# Patient Record
Sex: Female | Born: 1990 | Race: Black or African American | Hispanic: No | Marital: Single | Smoking: Current every day smoker
Health system: Southern US, Community
[De-identification: ages and names within clinical notes are randomized; demographics above are authoritative.]

## PROBLEM LIST (undated history)

## (undated) ENCOUNTER — Emergency Department (HOSPITAL_COMMUNITY): Payer: BC Managed Care – PPO

## (undated) ENCOUNTER — Inpatient Hospital Stay (HOSPITAL_COMMUNITY): Payer: Self-pay

## (undated) DIAGNOSIS — D219 Benign neoplasm of connective and other soft tissue, unspecified: Secondary | ICD-10-CM

## (undated) DIAGNOSIS — Z9189 Other specified personal risk factors, not elsewhere classified: Secondary | ICD-10-CM

## (undated) HISTORY — PX: APPENDECTOMY: SHX54

---

## 2015-04-17 ENCOUNTER — Other Ambulatory Visit (HOSPITAL_COMMUNITY)
Admission: RE | Admit: 2015-04-17 | Discharge: 2015-04-17 | Disposition: A | Discharge status: Home / Self Care | Payer: Self-pay | Source: Ambulatory Visit | Attending: Family Medicine | Admitting: Family Medicine

## 2015-04-17 ENCOUNTER — Encounter (HOSPITAL_COMMUNITY): Payer: Self-pay | Admitting: Emergency Medicine

## 2015-04-17 ENCOUNTER — Emergency Department (INDEPENDENT_AMBULATORY_CARE_PROVIDER_SITE_OTHER)
Admission: EM | Admit: 2015-04-17 | Discharge: 2015-04-17 | Disposition: A | Discharge status: Home / Self Care | Payer: Self-pay | Source: Home / Self Care | Attending: Family Medicine | Admitting: Family Medicine

## 2015-04-17 DIAGNOSIS — M545 Low back pain, unspecified: Secondary | ICD-10-CM

## 2015-04-17 DIAGNOSIS — M791 Myalgia, unspecified site: Secondary | ICD-10-CM

## 2015-04-17 DIAGNOSIS — N760 Acute vaginitis: Secondary | ICD-10-CM | POA: Insufficient documentation

## 2015-04-17 DIAGNOSIS — N730 Acute parametritis and pelvic cellulitis: Secondary | ICD-10-CM

## 2015-04-17 DIAGNOSIS — Z113 Encounter for screening for infections with a predominantly sexual mode of transmission: Secondary | ICD-10-CM | POA: Insufficient documentation

## 2015-04-17 LAB — POCT URINALYSIS DIP (DEVICE)
Bilirubin Urine: NEGATIVE
Glucose, UA: NEGATIVE mg/dL
Ketones, ur: NEGATIVE mg/dL
NITRITE: NEGATIVE
PH: 7 (ref 5.0–8.0)
PROTEIN: NEGATIVE mg/dL
SPECIFIC GRAVITY, URINE: 1.02 (ref 1.005–1.030)
Urobilinogen, UA: 0.2 mg/dL (ref 0.0–1.0)

## 2015-04-17 LAB — POCT PREGNANCY, URINE: Preg Test, Ur: NEGATIVE

## 2015-04-17 MED ORDER — CEFTRIAXONE SODIUM 1 G IJ SOLR
0.5000 g | Freq: Once | INTRAMUSCULAR | Status: AC
  Administered 2015-04-17: 0.5 g via INTRAMUSCULAR

## 2015-04-17 MED ORDER — AZITHROMYCIN 250 MG PO TABS
ORAL_TABLET | ORAL | Status: AC
  Filled 2015-04-17: qty 4

## 2015-04-17 MED ORDER — CEFTRIAXONE SODIUM 1 G IJ SOLR
INTRAMUSCULAR | Status: AC
  Filled 2015-04-17: qty 50

## 2015-04-17 MED ORDER — LIDOCAINE HCL (PF) 1 % IJ SOLN
INTRAMUSCULAR | Status: AC
  Filled 2015-04-17: qty 5

## 2015-04-17 MED ORDER — HYDROCODONE-ACETAMINOPHEN 5-325 MG PO TABS: 1.0000 | ORAL_TABLET | ORAL | Status: DC | PRN

## 2015-04-17 MED ORDER — AZITHROMYCIN 250 MG PO TABS
1000.0000 mg | ORAL_TABLET | Freq: Every day | ORAL | Status: DC
  Administered 2015-04-17: 1000 mg via ORAL

## 2015-04-17 MED ORDER — METRONIDAZOLE 500 MG PO TABS: 500.0000 mg | ORAL_TABLET | Freq: Two times a day (BID) | ORAL | Status: DC

## 2015-04-17 NOTE — ED Provider Notes (Signed)
CSN: [id number]     Arrival date & time 04/17/15  1534 History   First MD Initiated Contact with Patient 04/17/15 1656     Chief Complaint  Patient presents with  . Abdominal Pain   (Consider location/radiation/quality/duration/timing/severity/associated sxs/prior Treatment) HPI Comments: 24 year old obese female complaining of severe lower abdominal pain for 3 days. She states is been getting worse. She denies nausea, vomiting, diarrhea or fever. She does have urinary frequency but no dysuria. Denies vaginal discharge or bleeding. Denies fevers. She told the provider that nothing makes it worse but she told the nurse that it is worse when she laughs, pushes to urinate or sits forward. Lower abdominal pain associated with low back pain.   History reviewed. No pertinent past medical history. No past surgical history on file. History reviewed. No pertinent family history. History  Substance Use Topics  . Smoking status: Not on file  . Smokeless tobacco: Not on file  . Alcohol Use: Not on file   OB History    No data available     Review of Systems  Constitutional: Positive for activity change and appetite change. Negative for fever.  HENT: Negative.   Eyes: Negative.   Respiratory: Negative for cough and shortness of breath.   Cardiovascular: Negative for chest pain and leg swelling.  Gastrointestinal: Positive for abdominal pain. Negative for nausea, vomiting, diarrhea, constipation and abdominal distention.  Genitourinary: Positive for frequency. Negative for dysuria, urgency, vaginal bleeding, vaginal discharge and vaginal pain.  Musculoskeletal: Positive for back pain.  Skin: Negative.     Allergies  Review of patient's allergies indicates no known allergies.  Home Medications   Prior to Admission medications   Medication Sig Start Date End Date Taking? Authorizing Provider  HYDROcodone-acetaminophen (NORCO/VICODIN) 5-325 MG per tablet Take 1 tablet by mouth every 4  (four) hours as needed. 04/17/15   Janne Napoleon, NP  metroNIDAZOLE (FLAGYL) 500 MG tablet Take 1 tablet (500 mg total) by mouth 2 (two) times daily. X 7 days 04/17/15   Janne Napoleon, NP   BP 108/74 mmHg  Pulse 88  Temp(Src) 99.1 F (37.3 C) (Oral)  Resp 20  SpO2 97%  LMP 04/08/2015 Physical Exam  Constitutional: She is oriented to person, place, and time. She appears well-developed and well-nourished. No distress.  Neck: Normal range of motion. Neck supple.  Cardiovascular: Normal rate and normal heart sounds.   Pulmonary/Chest: Effort normal and breath sounds normal. No respiratory distress.  Abdominal: She exhibits no distension. There is tenderness. There is guarding.  Abdomen obese. Patient points to a overlying layer or roll of adipose as the source of pain. When this is retracted the pain is reproduced with palpation across the suprapubic and pelvis.  Genitourinary:  Normal external female genitalia with clitoral hood pierced jewelry. Thin bubbly green to tan vaginal discharge. Cervix midline. Several nabothian cyst. Several areas of patchy erythema to the ectocervix. Severe CMT and bilateral adnexal tenderness.  Musculoskeletal:  Tenderness that reproduces the pain located at the right low back musculature. No spinal tenderness or pain.   Neurological: She is alert and oriented to person, place, and time.  Skin: Skin is warm and dry.  Psychiatric: She has a normal mood and affect.  Nursing note and vitals reviewed.   ED Course  Procedures (including critical care time) Labs Review Labs Reviewed  POCT URINALYSIS DIP (DEVICE) - Abnormal; Notable for the following:    Hgb urine dipstick TRACE (*)    Leukocytes, UA TRACE (*)  All other components within normal limits  URINE CULTURE  POCT PREGNANCY, URINE  CERVICOVAGINAL ANCILLARY ONLY   Results for orders placed or performed during the hospital encounter of 04/17/15  POCT urinalysis dip (device)  Result Value Ref Range    Glucose, UA NEGATIVE NEGATIVE mg/dL   Bilirubin Urine NEGATIVE NEGATIVE   Ketones, ur NEGATIVE NEGATIVE mg/dL   Specific Gravity, Urine 1.020 1.005 - 1.030   Hgb urine dipstick TRACE (A) NEGATIVE   pH 7.0 5.0 - 8.0   Protein, ur NEGATIVE NEGATIVE mg/dL   Urobilinogen, UA 0.2 0.0 - 1.0 mg/dL   Nitrite NEGATIVE NEGATIVE   Leukocytes, UA TRACE (A) NEGATIVE  Pregnancy, urine POC  Result Value Ref Range   Preg Test, Ur NEGATIVE NEGATIVE    Imaging Review No results found.   MDM   1. PID (acute pelvic inflammatory disease)   2. Right-sided low back pain without sciatica   3. Muscle pain    [place], [place] present during exam. Rocephin 500 mg IM azithro 1 gm po Flagyl 500 bid norco 5 mg #15 Urine cult and cerv cytology pendin If worse go to the ED, may need U/S or IV ABX depending on presentation.    Janne Napoleon, NP 04/17/15 [phone]

## 2015-04-17 NOTE — Discharge Instructions (Signed)
Pelvic Inflammatory Disease Pelvic inflammatory disease (PID) refers to an infection in some or all of the female organs. The infection can be in the uterus, ovaries, fallopian tubes, or the surrounding tissues in the pelvis. PID can cause abdominal or pelvic pain that comes on suddenly (acute pelvic pain). PID is a serious infection because it can lead to lasting (chronic) pelvic pain or the inability to have children (infertile).  CAUSES  The infection is often caused by the normal bacteria found in the vaginal tissues. PID may also be caused by an infection that is spread during sexual contact. PID can also occur following:   The birth of a baby.   A miscarriage.   An abortion.   Major pelvic surgery.   The use of an intrauterine device (IUD).   A sexual assault.  RISK FACTORS Certain factors can put a person at higher risk for PID, such as:  Being younger than 25 years.  Being sexually active at [place] age.  Usingnonbarrier contraception.  Havingmultiple sexual partners.  Having sex with someone who has symptoms of a genital infection.  Using oral contraception. Other times, certain behaviors can increase the possibility of getting PID, such as:  Having sex during your period.  Using a vaginal douche.  Having an intrauterine device (IUD) in place. SYMPTOMS   Abdominal or pelvic pain.   Fever.   Chills.   Abnormal vaginal discharge.  Abnormal uterine bleeding.   Unusual pain shortly after finishing your period. DIAGNOSIS  Your caregiver will choose some of the following methods to make a diagnosis, such as:   Performinga physical exam and history. A pelvic exam typically reveals a very tender uterus and surrounding pelvis.   Ordering laboratory tests including a pregnancy test, blood tests, and urine test.  Orderingcultures of the vagina and cervix to check for a sexually transmitted infection (STI).  Performing an ultrasound.    Performing a laparoscopic procedure to look inside the pelvis.  TREATMENT   Antibiotic medicines may be prescribed and taken by mouth.   Sexual partners may be treated when the infection is caused by a sexually transmitted disease (STD).   Hospitalization may be needed to give antibiotics intravenously.  Surgery may be needed, but this is rare. It may take weeks until you are completely well. If you are diagnosed with PID, you should also be checked for human immunodeficiency virus (HIV). HOME CARE INSTRUCTIONS   If given, take your antibiotics as directed. Finish the medicine even if you start to feel better.   Only take over-the-counter or prescription medicines for pain, discomfort, or fever as directed by your caregiver.   Do not have sexual intercourse until treatment is completed or as directed by your caregiver. If PID is confirmed, your recent sexual partner(s) will need treatment.   Keep your follow-up appointments. SEEK MEDICAL CARE IF:   You have increased or abnormal vaginal discharge.   You need prescription medicine for your pain.   You vomit.   You cannot take your medicines.   Your partner has an STD.  SEEK IMMEDIATE MEDICAL CARE IF:   You have a fever.   You have increased abdominal or pelvic pain.   You have chills.   You have pain when you urinate.   You are not better after 72 hours following treatment.  MAKE SURE YOU:   Understand these instructions.  Will watch your condition.  Will get help right away if you are not doing well or get worse.  Document Released: 12/03/2005 Document Revised: 03/30/2013 Document Reviewed: 11/29/2011 [facility] Patient Information 2015 [place], [place]. This information is not intended to replace advice given to you by your health care provider. Make sure you discuss any questions you have with your health care provider.  Sexually Transmitted Disease A sexually transmitted disease (STD) is a  disease or infection often passed to another person during sex. However, STDs can be passed through nonsexual ways. An STD can be passed through:  Spit (saliva).  Semen.  Blood.  Mucus from the vagina.  Pee (urine). HOW CAN I LESSEN MY CHANCES OF GETTING AN STD?  Use:  Latex condoms.  Water-soluble lubricants with condoms. Do not use petroleum jelly or oils.  Dental dams. These are small pieces of latex that are used as a barrier during oral sex.  Avoid having more than one sex partner.  Do not have sex with someone who has other sex partners.  Do not have sex with anyone you do not know or who is at high risk for an STD.  Avoid risky sex that can break your skin.  Do not have sex if you have open sores on your mouth or skin.  Avoid drinking too much alcohol or taking illegal drugs. Alcohol and drugs can affect your good judgment.  Avoid oral and anal sex acts.  Get shots (vaccines) for HPV and hepatitis.  If you are at risk of being infected with HIV, it is advised that you take a certain medicine daily to prevent HIV infection. This is called pre-exposure prophylaxis (PrEP). You may be at risk if:  You are a man who has sex with other men (MSM).  You are attracted to the opposite sex (heterosexual) and are having sex with more than one partner.  You take drugs with a needle.  You have sex with someone who has HIV.  Talk with your doctor about if you are at high risk of being infected with HIV. If you begin to take PrEP, get tested for HIV first. Get tested every 3 months for as long as you are taking PrEP. WHAT SHOULD I DO IF I THINK I HAVE AN STD?  See your doctor.  Tell your sex partner(s) that you have an STD. They should be tested and treated.  Do not have sex until your doctor says it is okay. WHEN SHOULD I GET HELP? Get help right away if:  You have bad belly (abdominal) pain.  You are a man and have puffiness (swelling) or pain in your  testicles.  You are a woman and have puffiness in your vagina. Document Released: 01/10/2005 Document Revised: 12/08/2013 Document Reviewed: 05/29/2013 [facility] Patient Information 2015 [place], [place]. This information is not intended to replace advice given to you by your health care provider. Make sure you discuss any questions you have with your health care provider.

## 2015-04-17 NOTE — ED Notes (Signed)
C/o abd pain States she has lower abd pain which started three days ago States she has pain when she laughs, pushes to urinate and sitting forward States she is urinating frequently Does have lower back pain

## 2015-04-18 LAB — URINE CULTURE
COLONY COUNT: NO GROWTH
CULTURE: NO GROWTH
Special Requests: NORMAL

## 2015-04-18 LAB — CERVICOVAGINAL ANCILLARY ONLY
Chlamydia: NEGATIVE
NEISSERIA GONORRHEA: POSITIVE — AB
WET PREP (BD AFFIRM): POSITIVE — AB

## 2015-04-19 ENCOUNTER — Telehealth (HOSPITAL_COMMUNITY): Payer: Self-pay | Admitting: *Deleted

## 2015-04-19 NOTE — ED Notes (Addendum)
GC pos. Chlamydia neg., Trich pos., Urine culture: no growth.  Pt. adequately treated with Rocephin and Zithromax for GC and Flagyl for Trich. I called pt. and left a message to call. Call 1. Brianna Oliver 04/19/2015 Pt. called back.  Pt. verified x 2 and given results.  Pt. told she was adequately treated for both infections and to finish all of the Flagyl. She said she has not picked it up yet.  Pt. instructed to notify her partner to be tested and treated for both infections, no sex until Flagyl is finished and partner has been treated and to practice safe sex. Pt. told she should get HIV testing at the [facility]D clinic, by appointment.  Pt. voiced understanding.  No questions. Brianna Oliver 04/19/2015 DHHS form completed and faxed to the [facility] Department. 04/20/2015

## 2015-12-12 ENCOUNTER — Emergency Department (HOSPITAL_COMMUNITY)
Admission: EM | Admit: 2015-12-12 | Discharge: 2015-12-12 | Disposition: A | Discharge status: Home / Self Care | Payer: Self-pay | Attending: Emergency Medicine | Admitting: Emergency Medicine

## 2015-12-12 ENCOUNTER — Encounter (HOSPITAL_COMMUNITY): Payer: Self-pay | Admitting: *Deleted

## 2015-12-12 DIAGNOSIS — B353 Tinea pedis: Secondary | ICD-10-CM | POA: Insufficient documentation

## 2015-12-12 DIAGNOSIS — L03116 Cellulitis of left lower limb: Secondary | ICD-10-CM | POA: Insufficient documentation

## 2015-12-12 MED ORDER — CEPHALEXIN 500 MG PO CAPS: 500.0000 mg | ORAL_CAPSULE | Freq: Four times a day (QID) | ORAL | Status: DC

## 2015-12-12 MED ORDER — CLOTRIMAZOLE 1 % EX CREA: TOPICAL_CREAM | CUTANEOUS | Status: DC

## 2015-12-12 MED ORDER — IBUPROFEN 800 MG PO TABS: 800.0000 mg | ORAL_TABLET | Freq: Three times a day (TID) | ORAL | Status: DC | PRN

## 2015-12-12 NOTE — ED Notes (Signed)
Declined W/C at D/C and was escorted to lobby by RN. 

## 2015-12-12 NOTE — ED Provider Notes (Signed)
CSN: [id number]     Arrival date & time 12/12/15  1021 History   First MD Initiated Contact with Patient 12/12/15 1052     Chief Complaint  Patient presents with  . Foot Pain     (Consider location/radiation/quality/duration/timing/severity/associated sxs/prior Treatment) HPI   Pt presents with left heel pain.  States the skin has been cracked and red x 6 days and painful x 3 days.  Pain is sore and throbbing, aching, 10/10 intensity, radiating up into ankle and calf.  Denies fevers, chills, myalgias, CP, SOB, hemoptysis.  She has taken nothing for pain.  Works on her feet all day cleaning.  Denies any injury or falls.   History reviewed. No pertinent past medical history. History reviewed. No pertinent past surgical history. No family history on file. Social History  Substance Use Topics  . Smoking status: None  . Smokeless tobacco: None  . Alcohol Use: None   OB History    No data available     Review of Systems  Constitutional: Negative for fever and chills.  Cardiovascular: Negative for leg swelling.  Musculoskeletal: Negative for myalgias.  Skin: Positive for color change.  Allergic/Immunologic: Negative for immunocompromised state.  Neurological: Negative for weakness and numbness.  Hematological: Does not bruise/bleed easily.  Psychiatric/Behavioral: Negative for self-injury.      Allergies  Review of patient's allergies indicates no known allergies.  Home Medications   Prior to Admission medications   Medication Sig Start Date End Date Taking? Authorizing Provider  HYDROcodone-acetaminophen (NORCO/VICODIN) 5-325 MG per tablet Take 1 tablet by mouth every 4 (four) hours as needed. 04/17/15   Janne Napoleon, NP  metroNIDAZOLE (FLAGYL) 500 MG tablet Take 1 tablet (500 mg total) by mouth 2 (two) times daily. X 7 days 04/17/15   Janne Napoleon, NP   BP 127/76 mmHg  Pulse 118  Temp(Src) 98.2 F (36.8 C) (Oral)  Resp 20  Ht 5\' 6"  (1.676 m)  Wt 115.667 kg  BMI 41.18  kg/m2  SpO2 100% Physical Exam  Constitutional: She appears well-developed and well-nourished. No distress.  HENT:  Head: Normocephalic and atraumatic.  Neck: Neck supple.  Cardiovascular: Normal rate and intact distal pulses.   Pulmonary/Chest: Effort normal.  Musculoskeletal:  Left calf is nontender, no erythema, edema.  Left knee and ankle with full AROM, no bony tenderness, edema, erythema, warmth.    Neurological: She is alert. She exhibits normal muscle tone.  Skin: She is not diaphoretic.  Left posterior heel cracked with thickened dry skin and surrounding erythema, warmth, tenderness, mild edema.  No bony tenderness   Psychiatric: She has a normal mood and affect. Her behavior is normal.  Nursing note and vitals reviewed.   ED Course  Procedures (including critical care time) Labs Review Labs Reviewed - No data to display  Imaging Review No results found. I have personally reviewed and evaluated these images and lab results as part of my medical decision-making.   EKG Interpretation None      MDM   Final diagnoses:  Cellulitis of left heel  Tinea pedis of both feet    Afebrile, nontoxic patient with cellulitis of left heel from cracking of underlying dry skin/tinea pedis. Clinically no e/o DVT.  No systemic symptoms.   D/C home with keflex, lotrimin, ibuprofen.  Resources for PCP.   Discussed result, findings, treatment, and follow up  with patient.  Pt given return precautions.  Pt verbalizes understanding and agrees with plan.  Clayton Bibles, PA-C 12/12/15 1118  Dorie Rank, MD 12/13/15 [phone]

## 2015-12-12 NOTE — Discharge Instructions (Signed)
Read the information below.  Use the prescribed medication as directed.  Please discuss all new medications with your pharmacist.  You may return to the Emergency Department at any time for worsening condition or any new symptoms that concern you.   If you develop increased redness, swelling, pus draining from the wound, or fevers greater than 100.4, return to the ER immediately for a recheck.     Cellulitis Cellulitis is an infection of the skin and the tissue beneath it. The infected area is usually red and tender. Cellulitis occurs most often in the arms and lower legs.  CAUSES  Cellulitis is caused by bacteria that enter the skin through cracks or cuts in the skin. The most common types of bacteria that cause cellulitis are staphylococci and streptococci. SIGNS AND SYMPTOMS   Redness and warmth.  Swelling.  Tenderness or pain.  Fever. DIAGNOSIS  Your health care provider can usually determine what is wrong based on a physical exam. Blood tests may also be done. TREATMENT  Treatment usually involves taking an antibiotic medicine. HOME CARE INSTRUCTIONS   Take your antibiotic medicine as directed by your health care provider. Finish the antibiotic even if you start to feel better.  Keep the infected arm or leg elevated to reduce swelling.  Apply a warm cloth to the affected area up to 4 times per day to relieve pain.  Take medicines only as directed by your health care provider.  Keep all follow-up visits as directed by your health care provider. SEEK MEDICAL CARE IF:   You notice red streaks coming from the infected area.  Your red area gets larger or turns dark in color.  Your bone or joint underneath the infected area becomes painful after the skin has healed.  Your infection returns in the same area or another area.  You notice a swollen bump in the infected area.  You develop new symptoms.  You have a fever. SEEK IMMEDIATE MEDICAL CARE IF:   You feel very  sleepy.  You develop vomiting or diarrhea.  You have a general ill feeling (malaise) with muscle aches and pains.   This information is not intended to replace advice given to you by your health care provider. Make sure you discuss any questions you have with your health care provider.   Document Released: 09/12/2005 Document Revised: 08/24/2015 Document Reviewed: 02/18/2012 Elsevier Interactive Patient Education 2016 [place] Athlete's foot (tinea pedis) is a fungal infection of the skin on the feet. It often occurs on the skin between the toes or underneath the toes. It can also occur on the soles of the feet. Athlete's foot is more likely to occur in hot, humid weather. Not washing your feet or changing your socks often enough can contribute to athlete's foot. The infection can spread from person to person (contagious). CAUSES Athlete's foot is caused by a fungus. This fungus thrives in warm, moist places. Most people get athlete's foot by sharing shower stalls, towels, and wet floors with an infected person. People with weakened immune systems, including those with diabetes, may be more likely to get athlete's foot. SYMPTOMS   Itchy areas between the toes or on the soles of the feet.  White, flaky, or scaly areas between the toes or on the soles of the feet.  Tiny, intensely itchy blisters between the toes or on the soles of the feet.  Tiny cuts on the skin. These cuts can develop a bacterial infection.  Thick or discolored  toenails. DIAGNOSIS  Your caregiver can usually tell what the problem is by doing a physical exam. Your caregiver may also take a skin sample from the rash area. The skin sample may be examined under a microscope, or it may be tested to see if fungus will grow in the sample. A sample may also be taken from your toenail for testing. TREATMENT  Over-the-counter and prescription medicines can be used to kill the fungus. These medicines are  available as powders or creams. Your caregiver can suggest medicines for you. Fungal infections respond slowly to treatment. You may need to continue using your medicine for several weeks. PREVENTION   Do not share towels.  Wear sandals in wet areas, such as shared locker rooms and shared showers.  Keep your feet dry. Wear shoes that allow air to circulate. Wear cotton or wool socks. HOME CARE INSTRUCTIONS   Take medicines as directed by your caregiver. Do not use steroid creams on athlete's foot.  Keep your feet clean and cool. Wash your feet daily and dry them thoroughly, especially between your toes.  Change your socks every day. Wear cotton or wool socks. In hot climates, you may need to change your socks 2 to 3 times per day.  Wear sandals or canvas tennis shoes with good air circulation.  If you have blisters, soak your feet in Burow's solution or Epsom salts for 20 to 30 minutes, 2 times a day to dry out the blisters. Make sure you dry your feet thoroughly afterward. SEEK MEDICAL CARE IF:   You have a fever.  You have swelling, soreness, warmth, or redness in your foot.  You are not getting better after 7 days of treatment.  You are not completely cured after 30 days.  You have any problems caused by your medicines. MAKE SURE YOU:   Understand these instructions.  Will watch your condition.  Will get help right away if you are not doing well or get worse.   This information is not intended to replace advice given to you by your health care provider. Make sure you discuss any questions you have with your health care provider.   Document Released: 11/30/2000 Document Revised: 02/25/2012 Document Reviewed: 06/06/2015 Elsevier Interactive Patient Education 2016 [employer].    Emergency Department Resource Guide 1) Find a Doctor and Pay Out of Pocket Although you won't have to find out who is covered by your insurance plan, it is a good idea to ask around and get  recommendations. You will then need to call the office and see if the doctor you have chosen will accept you as a new patient and what types of options they offer for patients who are self-pay. Some doctors offer discounts or will set up payment plans for their patients who do not have insurance, but you will need to ask so you aren't surprised when you get to your appointment.  2) Contact Your Local Health Department Not all health departments have doctors that can see patients for sick visits, but many do, so it is worth a call to see if yours does. If you don't know where your local health department is, you can check in your phone book. The CDC also has a tool to help you locate your state's health department, and many state websites also have listings of all of their local health departments.  3) Find a [facility] If your illness is not likely to be very severe or complicated, you may want to try  a walk in clinic. These are popping up all over the country in pharmacies, drugstores, and shopping centers. They're usually staffed by nurse practitioners or physician assistants that have been trained to treat common illnesses and complaints. They're usually fairly quick and inexpensive. However, if you have serious medical issues or chronic medical problems, these are probably not your best option.  No Primary Care Doctor: - Call Health Connect at  [phone] - they can help you locate a primary care doctor that  accepts your insurance, provides certain services, etc. - Physician Referral Service- [phone]  Chronic Pain Problems: Organization         Address  Phone   Notes  [facility]  [phone] Patients need to be referred by their primary care doctor.   Medication Assistance: Organization         Address  Phone   Notes  [facility] Medication [facility] Karas P[place] [place], [place] [address] [phone] --Must be a resident of  [facility] -- Must have NO insurance coverage whatsoever (no Medicaid/ Medicare, etc.) -- The pt. MUST have a primary care doctor that directs their care regularly and follows them in the community   [facility]ist  [phone]   [employer]  [phone]    Agencies that provide inexpensive medical care: Organization         Address  Phone   Notes  [place]  [phone]   [facility]    [phone]   [facility] [place], [place] [address] [phone]   [place] [address] [place] [phone]   Planned Parenthood    [phone]   [facility]    [phone]   [facility] and [address], [place] Phone:  [phone], Fax:  [phone] Hours of Operation:  9 am - 6 pm, M-F.  Also accepts Medicaid/Medicare and self-pay.  [facility] [place], [address], Becky Colan [place] Phone: [phone], Fax: [phone]. Hours of Operation:  8:30 am - 5:30 pm, M-F.  Also accepts Medicaid and self-pay.  [facility] [place] [address], [place] Phone: [phone]   [place], [place], [place] [phone], Ext. [phone] Mondays & Thursdays: 7-9 AM.  First 15 patients are seen on a first come, first serve basis.    [place] Providers:  Organization         Address  Phone   Notes  [facility] [address], [place] [phone] Also accepts self-pay patients.  [facility] [id number], [place]  [phone]   [place], [address], [place] [phone]   [facility] [address] [place] [phone]   Lucianne Lei [address], [place]   [phone] Only accepts [place] Access  [place] patients after they have their name applied to their card.   Self-Pay (no insurance) in [facility]:  Organization         Address  Phone   Notes  Sickle Cell Patients, [facility] [place] [phone]   [facility] Urgent Care [place] [phone]  [facility] [place]  [place], [address],  [phone]   [facility]/Dr. Osei-Bonsu  [address] [place] or [address], [place] [phone] Phone number for both [place] and [place] locations is the same.  [facility] [address], [place] [phone]   [facility] [address], [place] or [address] [phone]   [facility] [address] [place] [phone], phone; [phone], fax Sees patients 1st and 3rd Saturday of every month.  Must not qualify for public or private insurance (i.e. Medicaid, Medicare, [place] Health Choice, Veterans' Benefits)  Household income should be no more than 200% of the poverty level The clinic cannot treat you if you are pregnant or think you are pregnant  Sexually transmitted diseases are not treated at the clinic.    Dental Care: Organization         Address  Phone  Notes  [facility] Department of [facility] [place] [phone] Accepts children up to age 6 who are enrolled in [place] or [place]; pregnant women with a Medicaid card; and children who have applied for Medicaid or [place] Health Choice, but were declined, whose parents can pay a reduced fee at time of service.  [facility] Department of [facility]  [address], [place] [phone] Accepts children up to age 71 who are enrolled in [place] or [place]; pregnant women with a Medicaid  card; and children who have applied for Medicaid or [place] Health Choice, but were declined, whose parents can pay a reduced fee at time of service.  [facility] Adult Dental Access PROGRAM  [place] [phone] Patients are seen by appointment only. Walk-ins are not accepted. [place] will see patients 89 years of age and older. Monday - Tuesday (8am-5pm) Most Wednesdays (8:30-5pm) $30 per visit, cash only  [facility] Adult Dental Access PROGRAM  [address], [facility] [phone] Patients are seen by appointment only. Walk-ins are not accepted. [place] will see patients 52 years of age and older. One Wednesday Evening (Monthly: Volunteer Based).  $30 per visit, cash only  [place]  [phone] for adults; Children under age 32, call Graduate Pediatric Dentistry at [phone]. Children aged 67-14, please call [phone] to request a pediatric application.  Dental services are provided in all areas of dental care including fillings, crowns and bridges, complete and partial dentures, implants, gum treatment, root canals, and extractions. Preventive care is also provided. Treatment is provided to both adults and children. Patients are selected via a lottery and there is often a waiting list.   [facility] [address] [place]  [phone] [email or web address]   [facility] [address] [place], [place] [phone], Ext. [phone] Second and Fourth Thursday of each month, opens at 6:30 AM; Clinic ends at 9 AM.  Patients are seen on a first-come first-served basis, and a limited number are seen during each clinic.   [facility]  [address] Hillard Danker [place], [place] [phone]   Eligibility Requirements You must have lived in [place], [place], or [place] counties for at least the last three months.   You cannot be eligible for state or federal sponsored healthcare  insurance,  including [employer], [place], or [employer].   You generally cannot be eligible for healthcare insurance through your employer.    How to apply: Eligibility screenings are held every Tuesday and Wednesday afternoon from 1:00 pm until 4:00 pm. You do not need an appointment for the interview!  [facility] [address] [place], [place]   [place]  [place] Department  [place]  [phone]    Behavioral Health Resources in the Community: Intensive Outpatient Programs Organization         Address  Phone  Notes  [place]. [address] [place], [place] [phone]   [facility] Outpatient [address], Kameisha Malicki [place], [place]   ADS: Alcohol & Drug Svcs [address] [place], [place]   [place] [address]  [place], [place] or [phone]   Substance Abuse Resources Organization         Address  Phone  Notes  Alcohol and Drug Services  [phone]   [place]  [phone]   [facility]   Chinita Pester  [phone]   Residential & Outpatient Substance Abuse Program  [phone]   Psychological Services Organization         Address  Phone  Notes  [facility]  [place]  [phone]   [place] [address] [place] or [phone]    Mobile Crisis Teams Organization         Address  Phone  Notes  [facility], Mobile Crisis Care Unit  [phone]   [facility]  [address] [place], [place]   [place] Levels [address], [place] [phone]    Self-Help/Support Groups Organization         Address  Phone             Notes  [place].  of [place] - variety of support groups  [place] Call for more information  Narcotics Anonymous (NA), [facility] [address], [employer] [place]  2 meetings at this location   Special educational needs teacher         Address  Phone  Notes  [facility] Residential Treatment [place],    [place]  [phone]   [facility]  [address], [place] [id number], [place], [place]   [place], [place] [phone] Admissions: 8am-3pm M-F  Incentives Substance [place] [address]    [place], [place] [phone]   [facility] [address] [place], [place], [place]   [facility] [address]  [place], [place]   [facility] Programs - Intensive Outpatient [address] Kristeen Mans [address], [place], [place]   [facility] [place].,  [place], [place] [phone] or [phone]   [facility] Services (RTS) [address], [place], [place] Accepts Medicaid  [facility] [place] [address],  [place] [phone] Substance Abuse/Addiction Treatment   [facility] Organization         Address  Phone  Notes  [facility]  [phone]   Domenic Schwab, PhD [address] [place], [place]   [phone] or (  [place]) [phone]   [facility]   [address] [place], [place] [phone]   [place] [address], [place], [place] [phone] Insurance/Medicaid/sponsorship through [facility] and Families [address], [address] Cher[address] Gaertner [place], [place] [phone] [place] [address]   [place], [place] [phone]    Dr. Adele Schilder  [phone]   [facility] Dept. 1) [address], [place] 2)  [place] 3)  [place] [address], [place] [phone]  [phone]   [place] [phone] or [phone] (After Hours)

## 2015-12-12 NOTE — ED Notes (Signed)
Pt reports posterior foot skin changed color. Pain worse on Friday.

## 2016-03-03 ENCOUNTER — Emergency Department (HOSPITAL_COMMUNITY): Payer: No Typology Code available for payment source

## 2016-03-03 ENCOUNTER — Encounter (HOSPITAL_COMMUNITY): Payer: Self-pay

## 2016-03-03 ENCOUNTER — Emergency Department (HOSPITAL_COMMUNITY)
Admission: EM | Admit: 2016-03-03 | Discharge: 2016-03-03 | Disposition: A | Discharge status: Home / Self Care | Payer: No Typology Code available for payment source | Attending: Emergency Medicine | Admitting: Emergency Medicine

## 2016-03-03 DIAGNOSIS — Y9389 Activity, other specified: Secondary | ICD-10-CM | POA: Insufficient documentation

## 2016-03-03 DIAGNOSIS — S199XXA Unspecified injury of neck, initial encounter: Secondary | ICD-10-CM | POA: Insufficient documentation

## 2016-03-03 DIAGNOSIS — S8992XA Unspecified injury of left lower leg, initial encounter: Secondary | ICD-10-CM | POA: Insufficient documentation

## 2016-03-03 DIAGNOSIS — Z3202 Encounter for pregnancy test, result negative: Secondary | ICD-10-CM | POA: Insufficient documentation

## 2016-03-03 DIAGNOSIS — M25562 Pain in left knee: Secondary | ICD-10-CM

## 2016-03-03 DIAGNOSIS — F17210 Nicotine dependence, cigarettes, uncomplicated: Secondary | ICD-10-CM | POA: Insufficient documentation

## 2016-03-03 DIAGNOSIS — M545 Low back pain, unspecified: Secondary | ICD-10-CM

## 2016-03-03 DIAGNOSIS — S29002A Unspecified injury of muscle and tendon of back wall of thorax, initial encounter: Secondary | ICD-10-CM | POA: Insufficient documentation

## 2016-03-03 DIAGNOSIS — Y92410 Unspecified street and highway as the place of occurrence of the external cause: Secondary | ICD-10-CM | POA: Insufficient documentation

## 2016-03-03 DIAGNOSIS — S3992XA Unspecified injury of lower back, initial encounter: Secondary | ICD-10-CM | POA: Insufficient documentation

## 2016-03-03 DIAGNOSIS — Y998 Other external cause status: Secondary | ICD-10-CM | POA: Insufficient documentation

## 2016-03-03 DIAGNOSIS — Z79899 Other long term (current) drug therapy: Secondary | ICD-10-CM | POA: Insufficient documentation

## 2016-03-03 LAB — POC URINE PREG, ED: Preg Test, Ur: NEGATIVE

## 2016-03-03 MED ORDER — METHOCARBAMOL 500 MG PO TABS
500.0000 mg | ORAL_TABLET | Freq: Two times a day (BID) | ORAL | Status: DC
Start: 2016-03-03 — End: 2018-06-03

## 2016-03-03 MED ORDER — NAPROXEN 500 MG PO TABS
500.0000 mg | ORAL_TABLET | Freq: Two times a day (BID) | ORAL | Status: DC
Start: 2016-03-03 — End: 2018-05-28
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
## 2016-03-03 NOTE — ED Notes (Signed)
Patient verbalized understanding of discharge instructions and denies any further needs or questions at this time. VS stable. Patient ambulatory with steady gait.  

## 2016-12-17 DIAGNOSIS — Z9189 Other specified personal risk factors, not elsewhere classified: Secondary | ICD-10-CM

## 2016-12-17 HISTORY — DX: Other specified personal risk factors, not elsewhere classified: Z91.89

## 2018-05-28 ENCOUNTER — Inpatient Hospital Stay (HOSPITAL_COMMUNITY)
Admission: AD | Admit: 2018-05-28 | Discharge: 2018-05-28 | Disposition: A | Discharge status: Home / Self Care | Payer: Self-pay | Source: Ambulatory Visit | Attending: Obstetrics and Gynecology | Admitting: Obstetrics and Gynecology

## 2018-05-28 ENCOUNTER — Ambulatory Visit (HOSPITAL_COMMUNITY)
Admission: EM | Admit: 2018-05-28 | Discharge: 2018-05-28 | Disposition: A | Discharge status: Home / Self Care | Payer: Self-pay | Attending: Family Medicine | Admitting: Family Medicine

## 2018-05-28 ENCOUNTER — Inpatient Hospital Stay (HOSPITAL_COMMUNITY): Payer: Self-pay

## 2018-05-28 ENCOUNTER — Other Ambulatory Visit: Payer: Self-pay

## 2018-05-28 ENCOUNTER — Encounter (HOSPITAL_COMMUNITY): Payer: Self-pay | Admitting: Emergency Medicine

## 2018-05-28 ENCOUNTER — Encounter (HOSPITAL_COMMUNITY): Payer: Self-pay | Admitting: Student

## 2018-05-28 DIAGNOSIS — R110 Nausea: Secondary | ICD-10-CM

## 2018-05-28 DIAGNOSIS — K5900 Constipation, unspecified: Secondary | ICD-10-CM | POA: Insufficient documentation

## 2018-05-28 DIAGNOSIS — R1032 Left lower quadrant pain: Secondary | ICD-10-CM | POA: Insufficient documentation

## 2018-05-28 DIAGNOSIS — R1030 Lower abdominal pain, unspecified: Secondary | ICD-10-CM

## 2018-05-28 DIAGNOSIS — F17210 Nicotine dependence, cigarettes, uncomplicated: Secondary | ICD-10-CM | POA: Insufficient documentation

## 2018-05-28 DIAGNOSIS — Z3201 Encounter for pregnancy test, result positive: Secondary | ICD-10-CM | POA: Insufficient documentation

## 2018-05-28 DIAGNOSIS — R109 Unspecified abdominal pain: Secondary | ICD-10-CM

## 2018-05-28 HISTORY — DX: Other specified personal risk factors, not elsewhere classified: Z91.89

## 2018-05-28 LAB — POCT URINALYSIS DIP (DEVICE)
BILIRUBIN URINE: NEGATIVE
Glucose, UA: 100 mg/dL — AB
Nitrite: NEGATIVE
Protein, ur: NEGATIVE mg/dL
Specific Gravity, Urine: 1.025 (ref 1.005–1.030)
Urobilinogen, UA: 1 mg/dL (ref 0.0–1.0)
pH: 6.5 (ref 5.0–8.0)

## 2018-05-28 LAB — CBC
HEMATOCRIT: 38.8 % (ref 36.0–46.0)
HEMOGLOBIN: 12.6 g/dL (ref 12.0–15.0)
MCH: 26.3 pg (ref 26.0–34.0)
MCHC: 32.5 g/dL (ref 30.0–36.0)
MCV: 81 fL (ref 78.0–100.0)
Platelets: 273 10*3/uL (ref 150–400)
RBC: 4.79 MIL/uL (ref 3.87–5.11)
RDW: 14.9 % (ref 11.5–15.5)
WBC: 11.8 10*3/uL — ABNORMAL HIGH (ref 4.0–10.5)

## 2018-05-28 LAB — WET PREP, GENITAL
Sperm: NONE SEEN
Trich, Wet Prep: NONE SEEN
Yeast Wet Prep HPF POC: NONE SEEN

## 2018-05-28 LAB — ABO/RH: ABO/RH(D): B POS

## 2018-05-28 LAB — POCT PREGNANCY, URINE: Preg Test, Ur: POSITIVE — AB

## 2018-05-28 LAB — HCG, QUANTITATIVE, PREGNANCY: HCG, BETA CHAIN, QUANT, S: 651 m[IU]/mL — AB (ref <5)

## 2018-05-28 MED ORDER — POLYETHYLENE GLYCOL 3350 17 G PO PACK: 17.0000 g | PACK | Freq: Every day | ORAL | 0 refills | Status: DC

## 2018-05-28 MED ORDER — DOCUSATE SODIUM 100 MG PO CAPS: 100.0000 mg | ORAL_CAPSULE | Freq: Three times a day (TID) | ORAL | 3 refills | Status: DC

## 2018-05-28 MED ORDER — ENEMA 7-19 GM/118ML RE ENEM: 1.0000 | ENEMA | Freq: Once | RECTAL | 1 refills | Status: AC

## 2018-05-28 NOTE — ED Provider Notes (Signed)
[facility]    CSN: [id number] Arrival date & time: 05/28/18  1447     History   Chief Complaint Chief Complaint  Patient presents with  . Possible Pregnancy    HPI Brianna Oliver is a 27 y.o. female.   27 year old female comes in for 1 week history of intermittent low abdominal pain.  States movement can make the pain worse.  Nausea without vomiting.  She has been having constipation, last bowel movement yesterday with straining.  Denies urinary symptoms such as frequency, dysuria, hematuria.  Denies vaginal discharge, itching, pain.  Denies vaginal spotting, bleeding.  States she is 1 week late for her cycle, took at home pregnancy test which was positive.  She has history of exploratory laparoscopic, appendectomy.  States she was told that her left fallopian tube was blocked.  Has history of PID. LMP 04/23/2018     History reviewed. No pertinent past medical history.  There are no active problems to display for this patient.   Past Surgical History:  Procedure Laterality Date  . APPENDECTOMY      OB History    Gravida  1   Para      Term      Preterm      AB      Living        SAB      TAB      Ectopic      Multiple      Live Births               Home Medications    Prior to Admission medications   Medication Sig Start Date End Date Taking? Authorizing Provider  methocarbamol (ROBAXIN) 500 MG tablet Take 1 tablet (500 mg total) by mouth 2 (two) times daily. 03/03/16   Hyman Bible, PA-C  naproxen (NAPROSYN) 500 MG tablet Take 1 tablet (500 mg total) by mouth 2 (two) times daily. 03/03/16   Hyman Bible, PA-C  polyethylene glycol (MIRALAX) packet Take 17 g by mouth daily. 05/28/18   Ok Edwards, PA-C    Family History History reviewed. No pertinent family history.  Social History Social History   Tobacco Use  . Smoking status: Current Every Day Smoker    Packs/day: 0.50    Types: Cigarettes  Substance Use Topics  .  Alcohol use: Yes    Comment: social  . Drug use: Yes    Types: Marijuana     Allergies   Patient has no known allergies.   Review of Systems Review of Systems  Reason unable to perform ROS: See HPI as above.     Physical Exam Triage Vital Signs ED Triage Vitals [05/28/18 1530]  Enc Vitals Group     BP 121/80     Pulse Rate 100     Resp 18     Temp 98.3 F (36.8 C)     Temp Source Oral     SpO2 98 %     Weight      Height      Head Circumference      Peak Flow      Pain Score      Pain Loc      Pain Edu?      Excl. in [place]?    No data found.  Updated Vital Signs BP 121/80 (BP Location: Right Arm)   Pulse 100   Temp 98.3 F (36.8 C) (Oral)   Resp 18   LMP 04/23/2018  SpO2 98%   Physical Exam  Constitutional: She is oriented to person, place, and time. She appears well-developed and well-nourished. No distress.  HENT:  Head: Normocephalic and atraumatic.  Eyes: Pupils are equal, round, and reactive to light. Conjunctivae are normal.  Cardiovascular: Normal rate, regular rhythm and normal heart sounds. Exam reveals no gallop and no friction rub.  No murmur heard. Pulmonary/Chest: Effort normal and breath sounds normal. She has no wheezes. She has no rales.  Abdominal: Soft. Bowel sounds are normal. She exhibits no mass. There is no tenderness. There is no rebound, no guarding and no CVA tenderness.  Neurological: She is alert and oriented to person, place, and time.  Skin: Skin is warm and dry.  Psychiatric: She has a normal mood and affect. Her behavior is normal. Judgment normal.     UC Treatments / Results  Labs (all labs ordered are listed, but only abnormal results are displayed) Labs Reviewed  POCT URINALYSIS DIP (DEVICE) - Abnormal; Notable for the following components:      Result Value   Glucose, UA 100 (*)    Ketones, ur TRACE (*)    Hgb urine dipstick TRACE (*)    Leukocytes, UA TRACE (*)    All other components within normal limits    POCT PREGNANCY, URINE - Abnormal; Notable for the following components:   Preg Test, Ur POSITIVE (*)    All other components within normal limits    EKG None  Radiology No results found.  Procedures Procedures (including critical care time)  Medications Ordered in UC Medications - No data to display  Initial Impression / Assessment and Plan / UC Course  I have reviewed the triage vital signs and the nursing notes.  Pertinent labs & imaging results that were available during my care of the patient were reviewed by me and considered in my medical decision making (see chart for details).    Discussed case with Dr. Mannie Stabile.  27 year old female with history of PID comes in for one-week history of intermittent low abdominal cramping with positive pregnancy test.   LMP 04/23/2018.  She denies current abdominal pain, vaginal bleeding, spotting.  abdomen soft, no tenderness to palpation.  Will have patient continue to monitor, and follow-up at [facility] ED if abdominal pain recurs.  Will have patient start MiraLAX for constipation.  Strict return precautions given.  Otherwise follow-up with OB for further evaluation and management needed.  Patient expresses understanding and agrees to plan.  Case discussed with Dr. Mannie Stabile, who agrees to plan.  Final Clinical Impressions(s) / UC Diagnoses   Final diagnoses:  Positive pregnancy test  Abdominal cramping    ED Prescriptions    Medication Sig Dispense Auth. Provider   polyethylene glycol (MIRALAX) packet Take 17 g by mouth daily. [address] each Tobin Chad, PA-C 05/28/18 1644

## 2018-05-28 NOTE — MAU Provider Note (Addendum)
History     CSN: [id number]  Arrival date and time: 05/28/18 1638   None     Chief Complaint  Patient presents with  . Abdominal Pain   Brianna Oliver is a 27 yo G1P0 who presents to MAU with a 1 week history of LLQ abdominal pain. Patient states it initially felt like onset of menses but has progressively worsened for the last week and is now 8/10 in severity. Patient can no longer sleep at night because of the pain and has left work early the last two days. Patient has also endorsed dizziness/lightheadedness and fatigue, but no N/V, no vaginal bleeding or discharge. Patient initially went to the urgent care earlier today where she was told she had a positive pregnancy test and was told to come to MAU for workup. She had a hysterosalpingogram in aug 2018 at an infertility clinic after inability to conceive with partner for 1 year. The results showed that her "left fallopian tube is blocked." Patient has also had a 1 week history of constipation, with only one small passage of hard stools.  Patient reports that yesterday she ate ribs, mashed potatoes for dinner and rice and shrimp for lunch. This is a typical diet for her.  OB History    Gravida  1   Para      Term      Preterm      AB      Living        SAB      TAB      Ectopic      Multiple      Live Births              Past Medical History:  Diagnosis Date  . At risk for infertility 2018  . Medical history non-contributory     Past Surgical History:  Procedure Laterality Date  . APPENDECTOMY      Family History  Problem Relation Age of Onset  . Anxiety disorder Neg Hx   . Birth defects Neg Hx   . Cancer Neg Hx   . COPD Neg Hx   . Depression Neg Hx   . Diabetes Neg Hx   . Drug abuse Neg Hx   . Early death Neg Hx   . Heart disease Neg Hx   . Hearing loss Neg Hx   . ADD / ADHD Neg Hx   . Alcohol abuse Neg Hx   . Arthritis Neg Hx   . Asthma Neg Hx   . Hypertension Neg Hx   . Hyperlipidemia  Neg Hx   . Intellectual disability Neg Hx   . Kidney disease Neg Hx   . Learning disabilities Neg Hx   . Miscarriages / Stillbirths Neg Hx   . Obesity Neg Hx   . Stroke Neg Hx   . Vision loss Neg Hx   . Varicose Veins Neg Hx     Social History   Tobacco Use  . Smoking status: Current Every Day Smoker    Packs/day: 0.50    Types: Cigarettes  . Smokeless tobacco: Never Used  Substance Use Topics  . Alcohol use: Yes    Comment: social  . Drug use: Yes    Types: Marijuana    Allergies: No Known Allergies  Medications Prior to Admission  Medication Sig Dispense Refill Last Dose  . methocarbamol (ROBAXIN) 500 MG tablet Take 1 tablet (500 mg total) by mouth 2 (two) times daily. 20 tablet 0   .  naproxen (NAPROSYN) 500 MG tablet Take 1 tablet (500 mg total) by mouth 2 (two) times daily. 30 tablet 0   . polyethylene glycol (MIRALAX) packet Take 17 g by mouth daily. 14 each 0     Review of Systems  Constitutional: Positive for activity change and fatigue. Negative for chills, diaphoresis and fever.  HENT: Negative.   Eyes: Negative.   Respiratory: Negative for cough, shortness of breath and wheezing.   Cardiovascular: Negative for chest pain, palpitations and leg swelling.  Gastrointestinal: Positive for abdominal pain and constipation.  Endocrine: Negative.   Genitourinary: Negative for urgency, vaginal bleeding, vaginal discharge and vaginal pain.  Musculoskeletal: Negative.   Skin: Negative for color change, pallor and rash.  Neurological: Positive for dizziness and light-headedness.  Psychiatric/Behavioral: Negative.    Physical Exam   Blood pressure 126/74, pulse 82, temperature 98.7 F (37.1 C), temperature source Oral, resp. rate 18, height 5\' 7"  (1.702 m), weight 123.9 kg (273 lb 4 oz), last menstrual period 04/20/2018, SpO2 100 %.  Physical Exam  Constitutional: She is oriented to person, place, and time. She appears well-developed and well-nourished. She appears  distressed.  HENT:  Head: Normocephalic and atraumatic.  Right Ear: External ear normal.  Left Ear: External ear normal.  Nose: Nose normal.  Eyes: Pupils are equal, round, and reactive to light. Conjunctivae are normal. Right eye exhibits no discharge. Left eye exhibits no discharge. No scleral icterus.  Neck: Normal range of motion. No thyromegaly present.  Cardiovascular: Normal rate and regular rhythm. Exam reveals no gallop and no friction rub.  Respiratory: Effort normal and breath sounds normal. No respiratory distress. She has no wheezes. She has no rales.  GI: Normal appearance and bowel sounds are normal. There is tenderness in the left lower quadrant.  Genitourinary: Vagina normal. There is no tenderness on the right labia. There is no tenderness on the left labia. Cervix exhibits no motion tenderness, no discharge and no friability. Right adnexum displays no tenderness. Left adnexum displays tenderness.  Musculoskeletal: Normal range of motion.  Neurological: She is alert and oriented to person, place, and time.  Skin: Skin is warm and dry. No erythema.  Psychiatric: She has a normal mood and affect. Her behavior is normal. Thought content normal.    MAU Course  Procedures  MDM Pregnancy test ordered.  Urinalysis, ABO/Rh, CBC, hGC, TVUS ordered for ectopic workup. GC/Chlamydia, Wet prep ordered.  Results were reassuring for IUP without ectopic pregnancy, and patient was determined to be stable and safe for d/c.  Assessment and Plan   Assessment:  Pregnancy with Constipation  Plan: - Pregnancy test positive; patient education provided - TVUS showed intrauterine gestational sax at 5w 1d; embryo and heartbeat not visualized; also showed 1.7cm fibroid in left uterine fundus and left ovarian corpus luteum cyst - Patient education provided on ultrasound findings and return/bleeding precautions - Follow-up ultrasound in 2 weeks for fetal viability recommended - Miralax and  Colace and home enema prescribed for constipation - Return precautions provided regarding abd pain if no resolution after bowel regimen - wet prep indicative of BV w/ clue cells; patient asymptomatic no tx necessary   Charlies Silvers 05/28/2018, 6:36 PM    I confirm that I have verified the information documented in the medical student's note and that I have also personally reperformed the physical exam and all medical decision making activities.  Assessment and Plan:  1. Abdominal pain   2. Constipation, unspecified constipation type  US Ob Comp Less 14 Wks  Result Date: 05/28/2018 CLINICAL DATA:  Left-sided abdominal and pelvic pain 1 week. Endometriosis. Gestational age by LMP of 5 weeks 3 days. EXAM: OBSTETRIC <14 WK [place] AND TRANSVAGINAL OB US TECHNIQUE: Both transabdominal and transvaginal ultrasound examinations were performed for complete evaluation of the gestation as well as the maternal uterus, adnexal regions, and pelvic cul-de-sac. Transvaginal technique was performed to assess early pregnancy. COMPARISON:  None. FINDINGS: Intrauterine gestational sac: Single Yolk sac:  Visualized. Embryo:  Not Visualized. MSD: 5 mm   5 w   1 d Subchorionic hemorrhage:  None visualized. Maternal uterus/adnexae: A 1.7 cm fibroid is seen in the left uterine fundus. Left ovarian corpus luteum cyst is seen measuring 2.8 cm. Right ovary is normal appearance. No adnexal mass or abnormal free fluid identified. IMPRESSION: Single early approximately 5 week intrauterine gestational sac. Consider following serial b-hCG levels, with followup ultrasound to assess viability in 10-14 days. Small uterine fibroid. Electronically Signed   By: Earle Gell M.D.   On: 05/28/2018 18:46   US Ob Transvaginal  Result Date: 05/28/2018 CLINICAL DATA:  Left-sided abdominal and pelvic pain 1 week. Endometriosis. Gestational age by LMP of 5 weeks 3 days. EXAM: OBSTETRIC <14 WK [place] AND TRANSVAGINAL OB US TECHNIQUE: Both  transabdominal and transvaginal ultrasound examinations were performed for complete evaluation of the gestation as well as the maternal uterus, adnexal regions, and pelvic cul-de-sac. Transvaginal technique was performed to assess early pregnancy. COMPARISON:  None. FINDINGS: Intrauterine gestational sac: Single Yolk sac:  Visualized. Embryo:  Not Visualized. MSD: 5 mm   5 w   1 d Subchorionic hemorrhage:  None visualized. Maternal uterus/adnexae: A 1.7 cm fibroid is seen in the left uterine fundus. Left ovarian corpus luteum cyst is seen measuring 2.8 cm. Right ovary is normal appearance. No adnexal mass or abnormal free fluid identified. IMPRESSION: Single early approximately 5 week intrauterine gestational sac. Consider following serial b-hCG levels, with followup ultrasound to assess viability in 10-14 days. Small uterine fibroid. Electronically Signed   By: Earle Gell M.D.   On: 05/28/2018 18:46    2.  Patient stable for discharge; reviewed warning signs (bleeding, pain) of when to return to MAU. [place] results reassuring; follow-up US for viability in 2 weeks ordered.   3. Patient encouraged to follow constipation relief measures; also discussed diet changes. Advised patient that pain should resolve as cyst resolves, but this may take some time. Patient verbalized understanding.   Maye Hides CNM

## 2018-05-28 NOTE — Progress Notes (Addendum)
G1 early pregn. Here for abd pain on the left side for past week.  Aug 2018 had lap procedure of the ovaries for endometriosis. They found that Left tube was blocked which is where her pain is. Denies bleeding.   LMP 5 may Medical student at [place] Provider and med student at bs for pelvic and GC/wetprep.   1810: pt to U/S 1844: back from U/s  Provider at bs reassessing.   1955: D/c instructions given with pt understanding. Pt left unit via ambulatory.

## 2018-05-28 NOTE — Discharge Instructions (Signed)
Pregnancy positive. As discussed, since you do not have abdominal pain right now, you do not have to go immediately to the hospital. But please monitor closely. If experiencing worsening abdominal pain, vaginal bleeding/spotting, please go to the [facility] emergency department for further evaluation. Otherwise, follow up with OB for further evaluation and management.  Please start miralax to see if constipation is causing the intermittent abdominal pain. Keep hydrated, your urine should be clear to pale yellow in color.

## 2018-05-28 NOTE — MAU Note (Signed)
+  HPt, confirmed at urgent care.  She is having really really severe abd pain, at the bottom, shifts from left to right. Been going on for like a week.  UC told her to come here as they couldn't do anything for her symptoms.

## 2018-05-28 NOTE — Discharge Instructions (Signed)
-  Miralax daily -Colace 3 times a day -Fleet enema at home; twice if necessary  Constipation, Adult Constipation is when a person:  Poops (has a bowel movement) fewer times in a week than normal.  Has a hard time pooping.  Has poop that is dry, hard, or bigger than normal.  Follow these instructions at home: Eating and drinking   Eat foods that have a lot of fiber, such as: ? Fresh fruits and vegetables. ? Whole grains. ? Beans.  Eat less of foods that are high in fat, low in fiber, or overly processed, such as: ? [place] fries. ? Hamburgers. ? Cookies. ? Candy. ? Soda.  Drink enough fluid to keep your pee (urine) clear or pale yellow. General instructions  Exercise regularly or as told by your doctor.  Go to the restroom when you feel like you need to poop. Do not hold it in.  Take over-the-counter and prescription medicines only as told by your doctor. These include any fiber supplements.  Do pelvic floor retraining exercises, such as: ? Doing deep breathing while relaxing your lower belly (abdomen). ? Relaxing your pelvic floor while pooping.  Watch your condition for any changes.  Keep all follow-up visits as told by your doctor. This is important. Contact a doctor if:  You have pain that gets worse.  You have a fever.  You have not pooped for 4 days.  You throw up (vomit).  You are not hungry.  You lose weight.  You are bleeding from the anus.  You have thin, pencil-like poop (stool). Get help right away if:  You have a fever, and your symptoms suddenly get worse.  You leak poop or have blood in your poop.  Your belly feels hard or bigger than normal (is bloated).  You have very bad belly pain.  You feel dizzy or you faint. This information is not intended to replace advice given to you by your health care provider. Make sure you discuss any questions you have with your health care provider. Document Released: 05/21/2008 Document Revised:  06/22/2016 Document Reviewed: 05/23/2016 Elsevier Interactive Patient Education  2018 [employer].

## 2018-05-28 NOTE — ED Triage Notes (Signed)
Pt here for possible pregnancy

## 2018-05-29 LAB — GC/CHLAMYDIA PROBE AMP (~~LOC~~) NOT AT ARMC
Chlamydia: NEGATIVE
NEISSERIA GONORRHEA: NEGATIVE

## 2018-06-02 ENCOUNTER — Inpatient Hospital Stay (HOSPITAL_COMMUNITY)
Admission: AD | Admit: 2018-06-02 | Discharge: 2018-06-03 | Disposition: A | Discharge status: Home / Self Care | Payer: Self-pay | Source: Ambulatory Visit | Attending: Obstetrics and Gynecology | Admitting: Obstetrics and Gynecology

## 2018-06-02 DIAGNOSIS — F17210 Nicotine dependence, cigarettes, uncomplicated: Secondary | ICD-10-CM | POA: Insufficient documentation

## 2018-06-02 DIAGNOSIS — Z3A01 Less than 8 weeks gestation of pregnancy: Secondary | ICD-10-CM | POA: Insufficient documentation

## 2018-06-02 DIAGNOSIS — O3411 Maternal care for benign tumor of corpus uteri, first trimester: Secondary | ICD-10-CM | POA: Insufficient documentation

## 2018-06-02 DIAGNOSIS — O26891 Other specified pregnancy related conditions, first trimester: Secondary | ICD-10-CM

## 2018-06-02 DIAGNOSIS — O3410 Maternal care for benign tumor of corpus uteri, unspecified trimester: Secondary | ICD-10-CM

## 2018-06-02 DIAGNOSIS — D259 Leiomyoma of uterus, unspecified: Secondary | ICD-10-CM | POA: Insufficient documentation

## 2018-06-02 DIAGNOSIS — O99331 Smoking (tobacco) complicating pregnancy, first trimester: Secondary | ICD-10-CM | POA: Insufficient documentation

## 2018-06-02 DIAGNOSIS — R109 Unspecified abdominal pain: Secondary | ICD-10-CM | POA: Insufficient documentation

## 2018-06-02 HISTORY — DX: Morbid (severe) obesity due to excess calories: E66.01

## 2018-06-02 HISTORY — DX: Benign neoplasm of connective and other soft tissue, unspecified: D21.9

## 2018-06-03 ENCOUNTER — Inpatient Hospital Stay (HOSPITAL_COMMUNITY): Payer: Self-pay

## 2018-06-03 ENCOUNTER — Encounter (HOSPITAL_COMMUNITY): Payer: Self-pay | Admitting: Student

## 2018-06-03 DIAGNOSIS — O26891 Other specified pregnancy related conditions, first trimester: Secondary | ICD-10-CM

## 2018-06-03 DIAGNOSIS — O3410 Maternal care for benign tumor of corpus uteri, unspecified trimester: Secondary | ICD-10-CM

## 2018-06-03 DIAGNOSIS — D259 Leiomyoma of uterus, unspecified: Secondary | ICD-10-CM

## 2018-06-03 LAB — URINALYSIS, ROUTINE W REFLEX MICROSCOPIC
BILIRUBIN URINE: NEGATIVE
Glucose, UA: NEGATIVE mg/dL
Ketones, ur: NEGATIVE mg/dL
NITRITE: NEGATIVE
PROTEIN: NEGATIVE mg/dL
Specific Gravity, Urine: 1.025 (ref 1.005–1.030)
pH: 5 (ref 5.0–8.0)

## 2018-06-03 NOTE — Discharge Instructions (Signed)
Uterine Fibroids Uterine fibroids are tissue masses (tumors) that can develop in the womb (uterus). They are also called leiomyomas. This type of tumor is not cancerous (benign) and does not spread to other parts of the body outside of the pelvic area, which is between the hip bones. Occasionally, fibroids may develop in the fallopian tubes, in the cervix, or on the support structures (ligaments) that surround the uterus. You can have one or many fibroids. Fibroids can vary in size, weight, and where they grow in the uterus. Some can become quite large. Most fibroids do not require medical treatment. What are the causes? A fibroid can develop when a single uterine cell keeps growing (replicating). Most cells in the human body have a control mechanism that keeps them from replicating without control. What are the signs or symptoms? Symptoms may include:  Heavy bleeding during your period.  Bleeding or spotting between periods.  Pelvic pain and pressure.  Bladder problems, such as needing to urinate more often (urinary frequency) or urgently.  Inability to reproduce offspring (infertility).  Miscarriages.  How is this diagnosed? Uterine fibroids are diagnosed through a physical exam. Your health care provider may feel the lumpy tumors during a pelvic exam. Ultrasonography and an MRI may be done to determine the size, location, and number of fibroids. How is this treated? Treatment may include:  Watchful waiting. This involves getting the fibroid checked by your health care provider to see if it grows or shrinks. Follow your health care provider's recommendations for how often to have this checked.  Hormone medicines. These can be taken by mouth or given through an intrauterine device (IUD).  Surgery. ? Removing the fibroids (myomectomy) or the uterus (hysterectomy). ? Removing blood supply to the fibroids (uterine artery embolization).  If fibroids interfere with your fertility and you  want to become pregnant, your health care provider may recommend having the fibroids removed. Follow these instructions at home:  Keep all follow-up visits as directed by your health care provider. This is important.  Take over-the-counter and prescription medicines only as told by your health care provider. ? If you were prescribed a hormone treatment, take the hormone medicines exactly as directed.  Ask your health care provider about taking iron pills and increasing the amount of dark green, leafy vegetables in your diet. These actions can help to boost your blood iron levels, which may be affected by heavy menstrual bleeding.  Pay close attention to your period and tell your health care provider about any changes, such as: ? Increased blood flow that requires you to use more pads or tampons than usual per month. ? A change in the number of days that your period lasts per month. ? A change in symptoms that are associated with your period, such as abdominal cramping or back pain. Contact a health care provider if:  You have pelvic pain, back pain, or abdominal cramps that cannot be controlled with medicines.  You have an increase in bleeding between and during periods.  You soak tampons or pads in a half hour or less.  You feel lightheaded, extra tired, or weak. Get help right away if:  You faint.  You have a sudden increase in pelvic pain. This information is not intended to replace advice given to you by your health care provider. Make sure you discuss any questions you have with your health care provider. Document Released: 11/30/2000 Document Revised: 08/02/2016 Document Reviewed: 06/01/2014 Elsevier Interactive Patient Education  2018 [employer]  

## 2018-06-03 NOTE — MAU Note (Addendum)
Pt reports lower abdominal pain that started over a week ago. States she has been taking extra strength tylenol-does not help. States the pain starts when she stands and gets a little better when she sits. States the pain is sharp. Pt states she had spotting yesterday, but none today. Pt denies discharge, urinary s/s, or constipation.

## 2018-06-03 NOTE — MAU Provider Note (Signed)
History     CSN: [id number]  Arrival date and time: 06/02/18 2351   First Provider Initiated Contact with Patient 06/03/18 0032      Chief Complaint  Patient presents with  . Abdominal Pain   HPI Brianna Oliver is a 27 y.o. G1P0 at [redacted]w[redacted]d who presents with abdominal pain. Reports lower abdominal pain worse on left side for the last week. Rates pain 6/10. Has been taking tylenol without relief. Pain worse with standing, improves with sitting. Was seen in MAU last week and diagnosed with constipation. Reports improvement & no longer constipated. Last BM was this morning. Denies n/v/d, vaginal bleeding, vaginal discharge, or dysuria.   OB History    Gravida  1   Para      Term      Preterm      AB      Living        SAB      TAB      Ectopic      Multiple      Live Births              Past Medical History:  Diagnosis Date  . At risk for infertility 2018  . Fibroid   . Morbid obesity ([place])     Past Surgical History:  Procedure Laterality Date  . APPENDECTOMY      Family History  Problem Relation Age of Onset  . Anxiety disorder Neg Hx   . Birth defects Neg Hx   . Cancer Neg Hx   . COPD Neg Hx   . Depression Neg Hx   . Diabetes Neg Hx   . Drug abuse Neg Hx   . Early death Neg Hx   . Heart disease Neg Hx   . Hearing loss Neg Hx   . ADD / ADHD Neg Hx   . Alcohol abuse Neg Hx   . Arthritis Neg Hx   . Asthma Neg Hx   . Hypertension Neg Hx   . Hyperlipidemia Neg Hx   . Intellectual disability Neg Hx   . Kidney disease Neg Hx   . Learning disabilities Neg Hx   . Miscarriages / Stillbirths Neg Hx   . Obesity Neg Hx   . Stroke Neg Hx   . Vision loss Neg Hx   . Varicose Veins Neg Hx     Social History   Tobacco Use  . Smoking status: Current Every Day Smoker    Packs/day: 0.50    Types: Cigarettes  . Smokeless tobacco: Never Used  Substance Use Topics  . Alcohol use: Yes    Comment: social; not while preg  . Drug use: Yes    Types:  Marijuana    Comment: last use 28 May 2018    Allergies: No Known Allergies  Medications Prior to Admission  Medication Sig Dispense Refill Last Dose  . docusate sodium (COLACE) 100 MG capsule Take 1 capsule (100 mg total) by mouth 3 (three) times daily. 60 capsule 3 More than a month at Unknown time  . methocarbamol (ROBAXIN) 500 MG tablet Take 1 tablet (500 mg total) by mouth 2 (two) times daily. 20 tablet 0   . polyethylene glycol (MIRALAX) packet Take 17 g by mouth daily. 14 each 0     Review of Systems  Constitutional: Negative.   Gastrointestinal: Positive for abdominal pain.  Genitourinary: Negative.    Physical Exam   Blood pressure 118/66, pulse 87, temperature 98.5 F (36.9 C), temperature  source Oral, resp. rate 20, height 5\' 7"  (1.702 m), weight 274 lb (124.3 kg), last menstrual period 04/20/2018, SpO2 100 %.  Physical Exam  Nursing note and vitals reviewed. Constitutional: She is oriented to person, place, and time. She appears well-developed and well-nourished. No distress.  HENT:  Head: Normocephalic and atraumatic.  Eyes: Conjunctivae are normal. Right eye exhibits no discharge. Left eye exhibits no discharge. No scleral icterus.  Neck: Normal range of motion.  Respiratory: Effort normal. No respiratory distress.  GI: Soft. Bowel sounds are normal. She exhibits no distension. There is tenderness in the suprapubic area and left lower quadrant. There is no rigidity and no guarding.  Genitourinary: No bleeding in the vagina.  Genitourinary Comments: Cervix closed  Neurological: She is alert and oriented to person, place, and time.  Skin: Skin is warm and dry. She is not diaphoretic.  Psychiatric: She has a normal mood and affect. Her behavior is normal. Judgment and thought content normal.    MAU Course  Procedures Results for orders placed or performed during the hospital encounter of 06/02/18 (from the past 24 hour(s))  Urinalysis, Routine w reflex  microscopic     Status: Abnormal   Collection Time: 06/03/18 12:16 AM  Result Value Ref Range   Color, Urine YELLOW YELLOW   APPearance HAZY (A) CLEAR   Specific Gravity, Urine 1.025 1.005 - 1.030   pH 5.0 5.0 - 8.0   Glucose, UA NEGATIVE NEGATIVE mg/dL   Hgb urine dipstick SMALL (A) NEGATIVE   Bilirubin Urine NEGATIVE NEGATIVE   Ketones, ur NEGATIVE NEGATIVE mg/dL   Protein, ur NEGATIVE NEGATIVE mg/dL   Nitrite NEGATIVE NEGATIVE   Leukocytes, UA TRACE (A) NEGATIVE   RBC / HPF 0-5 0 - 5 RBC/hpf   WBC, UA 11-20 0 - 5 WBC/hpf   Bacteria, UA RARE (A) NONE SEEN   Squamous Epithelial / LPF 0-5 0 - 5   Mucus PRESENT    US Ob Transvaginal  Result Date: 06/03/2018 CLINICAL DATA:  Abdominal pain and spotting. Gestational age by last menstrual period 6 weeks and 2 days. EXAM: TRANSVAGINAL OB ULTRASOUND TECHNIQUE: Transvaginal ultrasound was performed for complete evaluation of the gestation as well as the maternal uterus, adnexal regions, and pelvic cul-de-sac. COMPARISON:  Obstetrical ultrasound May 28, 2018. FINDINGS: Intrauterine gestational sac: Present. Yolk sac:  Present. Embryo:  Not present. Cardiac Activity: Not present. MSD: 6 mm   5 w   2 d Subchorionic hemorrhage: Small heterogeneous suspected subchorionic hemorrhage inferior to gestational sac. Maternal uterus/adnexae: 3.3 cm LEFT corpus luteal cyst. 21 mm hypoechoic intramural leiomyoma. No free fluid. IMPRESSION: 1. Probable early intrauterine gestational and yolk sac, without fetal pole, or cardiac activity yet visualized. Small suspected subchorionic hemorrhage. Recommend follow-up quantitative B-HCG levels and follow-up US in 14 days to confirm and assess viability. This recommendation follows SRU consensus guidelines: Diagnostic Criteria for Nonviable Pregnancy Early in the First Trimester. [employer] Med 2013; 409:8119-14. Electronically Signed   By: Elon Alas M.D.   On: 06/03/2018 01:18    MDM Tender over left lower  abdomen and suprapubic area. Ultrasound shows IUGS with yolk sac. Patient has a small fibroid on left side.  U/a + leuks -- will send for urine culture. No urinary symptoms at this time.   Assessment and Plan  A: 1. Uterine fibroid during pregnancy, antepartum   2. Abdominal pain during pregnancy in first trimester   3. Less than [redacted] weeks gestation of pregnancy  P: Discharge home Continue tylenol PRN Discussed reasons to return to MAU Call ultrasound to schedule f/u outpatient viability scan (per previous MAU visit) Urine culture pending  Jorje Guild 06/03/2018, 12:32 AM

## 2018-06-05 ENCOUNTER — Telehealth: Payer: Self-pay | Admitting: Student

## 2018-06-05 DIAGNOSIS — O2341 Unspecified infection of urinary tract in pregnancy, first trimester: Secondary | ICD-10-CM

## 2018-06-05 LAB — CULTURE, OB URINE: Culture: >=100000 — AB

## 2018-06-05 MED ORDER — CEPHALEXIN 500 MG PO CAPS: 500.0000 mg | ORAL_CAPSULE | Freq: Four times a day (QID) | ORAL | 0 refills | Status: DC

## 2018-06-05 NOTE — Telephone Encounter (Signed)
Pt returned phone call. Verified by name & DOB. Discussed results of urine culture. NKDA.  Updated pharmacy.  Rx sent to pharmacy for UTI.  All questions answered.   Jorje Guild, NP

## 2018-06-05 NOTE — Telephone Encounter (Signed)
Left voicemail. Needs to be treated for positive urine culture.   Jorje Guild, NP

## 2018-06-07 ENCOUNTER — Other Ambulatory Visit: Payer: Self-pay | Admitting: Student

## 2018-06-07 ENCOUNTER — Inpatient Hospital Stay (HOSPITAL_COMMUNITY)
Admission: AD | Admit: 2018-06-07 | Discharge: 2018-06-07 | Disposition: A | Discharge status: Home / Self Care | Payer: Self-pay | Source: Ambulatory Visit | Attending: Obstetrics & Gynecology | Admitting: Obstetrics & Gynecology

## 2018-06-07 ENCOUNTER — Encounter (HOSPITAL_COMMUNITY): Payer: Self-pay

## 2018-06-07 DIAGNOSIS — R109 Unspecified abdominal pain: Secondary | ICD-10-CM | POA: Insufficient documentation

## 2018-06-07 DIAGNOSIS — O3410 Maternal care for benign tumor of corpus uteri, unspecified trimester: Principal | ICD-10-CM

## 2018-06-07 DIAGNOSIS — O99331 Smoking (tobacco) complicating pregnancy, first trimester: Secondary | ICD-10-CM | POA: Insufficient documentation

## 2018-06-07 DIAGNOSIS — O3411 Maternal care for benign tumor of corpus uteri, first trimester: Secondary | ICD-10-CM | POA: Insufficient documentation

## 2018-06-07 DIAGNOSIS — O99211 Obesity complicating pregnancy, first trimester: Secondary | ICD-10-CM | POA: Insufficient documentation

## 2018-06-07 DIAGNOSIS — E669 Obesity, unspecified: Secondary | ICD-10-CM | POA: Insufficient documentation

## 2018-06-07 DIAGNOSIS — D259 Leiomyoma of uterus, unspecified: Secondary | ICD-10-CM

## 2018-06-07 DIAGNOSIS — O26891 Other specified pregnancy related conditions, first trimester: Secondary | ICD-10-CM | POA: Insufficient documentation

## 2018-06-07 DIAGNOSIS — F17210 Nicotine dependence, cigarettes, uncomplicated: Secondary | ICD-10-CM | POA: Insufficient documentation

## 2018-06-07 DIAGNOSIS — Z3A01 Less than 8 weeks gestation of pregnancy: Secondary | ICD-10-CM | POA: Insufficient documentation

## 2018-06-07 DIAGNOSIS — O2341 Unspecified infection of urinary tract in pregnancy, first trimester: Secondary | ICD-10-CM | POA: Insufficient documentation

## 2018-06-07 LAB — URINALYSIS, ROUTINE W REFLEX MICROSCOPIC
BACTERIA UA: NONE SEEN
Bilirubin Urine: NEGATIVE
Glucose, UA: NEGATIVE mg/dL
KETONES UR: NEGATIVE mg/dL
Leukocytes, UA: NEGATIVE
NITRITE: NEGATIVE
PROTEIN: NEGATIVE mg/dL
Specific Gravity, Urine: 1.014 (ref 1.005–1.030)
pH: 7 (ref 5.0–8.0)

## 2018-06-07 LAB — HCG, QUANTITATIVE, PREGNANCY: HCG, BETA CHAIN, QUANT, S: 779 m[IU]/mL — AB (ref <5)

## 2018-06-07 NOTE — MAU Note (Signed)
Pt presents to MAU with c/o lower abdominal cramping that started over a week ago and has had bright red bleeding that started yesterday, today has had spotting.

## 2018-06-07 NOTE — Discharge Instructions (Signed)
Abdominal Pain During Pregnancy Abdominal pain is common in pregnancy. Most of the time, it does not cause harm. There are many causes of abdominal pain. Some causes are more serious than others and sometimes the cause is not known. Abdominal pain can be a sign that something is very wrong with the pregnancy or the pain may have nothing to do with the pregnancy. Always tell your health care provider if you have any abdominal pain. Follow these instructions at home:  Do not have sex or put anything in your vagina until your symptoms go away completely.  Watch your abdominal pain for any changes.  Get plenty of rest until your pain improves.  Drink enough fluid to keep your urine clear or pale yellow.  Take over-the-counter or prescription medicines only as told by your health care provider.  Keep all follow-up visits as told by your health care provider. This is important. Contact a health care provider if:  You have a fever.  Your pain gets worse or you have cramping.  Your pain continues after resting. Get help right away if:  You are bleeding, leaking fluid, or passing tissue from the vagina.  You have vomiting or diarrhea that does not go away.  You have painful or bloody urination.  You notice a decrease in your baby's movements.  You feel very weak or faint.  You have shortness of breath.  You develop a severe headache with abdominal pain.  You have abnormal vaginal discharge with abdominal pain. This information is not intended to replace advice given to you by your health care provider. Make sure you discuss any questions you have with your health care provider. Document Released: 12/03/2005 Document Revised: 09/13/2016 Document Reviewed: 07/02/2013 Elsevier Interactive Patient Education  2018 [employer]. Abdominal Pain During Pregnancy Abdominal pain is common in pregnancy. Most of the time, it does not cause harm. There are many causes of abdominal pain.  Some causes are more serious than others and sometimes the cause is not known. Abdominal pain can be a sign that something is very wrong with the pregnancy or the pain may have nothing to do with the pregnancy. Always tell your health care provider if you have any abdominal pain. Follow these instructions at home:  Do not have sex or put anything in your vagina until your symptoms go away completely.  Watch your abdominal pain for any changes.  Get plenty of rest until your pain improves.  Drink enough fluid to keep your urine clear or pale yellow.  Take over-the-counter or prescription medicines only as told by your health care provider.  Keep all follow-up visits as told by your health care provider. This is important. Contact a health care provider if:  You have a fever.  Your pain gets worse or you have cramping.  Your pain continues after resting. Get help right away if:  You are bleeding, leaking fluid, or passing tissue from the vagina.  You have vomiting or diarrhea that does not go away.  You have painful or bloody urination.  You notice a decrease in your baby's movements.  You feel very weak or faint.  You have shortness of breath.  You develop a severe headache with abdominal pain.  You have abnormal vaginal discharge with abdominal pain. This information is not intended to replace advice given to you by your health care provider. Make sure you discuss any questions you have with your health care provider. Document Released: 12/03/2005 Document Revised: 09/13/2016 Document  Reviewed: 07/02/2013 Elsevier Interactive Patient Education  [employer].

## 2018-06-07 NOTE — MAU Provider Note (Signed)
History   Ms. Raul Del is a 27 year old G1, P0 at approximately 6 weeks and 6 days by ultrasound on 6/18 and with complaints of vaginal bleeding and abdominal pain.  She was diagnosed with a UTI and medication was seen in.  CSN: [id number]  Arrival date & time 06/07/18  1312   None     No chief complaint on file.   HPI  Past Medical History:  Diagnosis Date  . At risk for infertility 2018  . Fibroid   . Morbid obesity ([place])     Past Surgical History:  Procedure Laterality Date  . APPENDECTOMY      Family History  Problem Relation Age of Onset  . Anxiety disorder Neg Hx   . Birth defects Neg Hx   . Cancer Neg Hx   . COPD Neg Hx   . Depression Neg Hx   . Diabetes Neg Hx   . Drug abuse Neg Hx   . Early death Neg Hx   . Heart disease Neg Hx   . Hearing loss Neg Hx   . ADD / ADHD Neg Hx   . Alcohol abuse Neg Hx   . Arthritis Neg Hx   . Asthma Neg Hx   . Hypertension Neg Hx   . Hyperlipidemia Neg Hx   . Intellectual disability Neg Hx   . Kidney disease Neg Hx   . Learning disabilities Neg Hx   . Miscarriages / Stillbirths Neg Hx   . Obesity Neg Hx   . Stroke Neg Hx   . Vision loss Neg Hx   . Varicose Veins Neg Hx     Social History   Tobacco Use  . Smoking status: Current Every Day Smoker    Packs/day: 0.50    Types: Cigarettes  . Smokeless tobacco: Never Used  Substance Use Topics  . Alcohol use: Yes    Comment: social; not while preg  . Drug use: Yes    Types: Marijuana    Comment: last use 28 May 2018    OB History    Gravida  1   Para      Term      Preterm      AB      Living        SAB      TAB      Ectopic      Multiple      Live Births              Review of Systems  Constitutional: Negative.   HENT: Negative.   Eyes: Negative.   Respiratory: Negative.   Cardiovascular: Negative.   Gastrointestinal: Positive for abdominal pain.  Endocrine: Negative.   Genitourinary: Positive for vaginal bleeding.   Musculoskeletal: Negative.   Skin: Negative.   Allergic/Immunologic: Negative.   Neurological: Negative.   Hematological: Negative.   Psychiatric/Behavioral: Negative.     Allergies  Patient has no known allergies.  Home Medications    LMP 04/20/2018   Physical Exam  Constitutional: She is oriented to person, place, and time. She appears well-developed and well-nourished.  HENT:  Head: Normocephalic.  Neck: Normal range of motion.  Cardiovascular: Normal rate, regular rhythm, normal heart sounds and intact distal pulses.  Pulmonary/Chest: Effort normal and breath sounds normal.  Abdominal: Soft. Bowel sounds are normal.  Genitourinary: Vagina normal and uterus normal.  Musculoskeletal: Normal range of motion.  Neurological: She is alert and oriented to person, place, and time.  Skin: Skin is  warm and dry.  Psychiatric: She has a normal mood and affect. Her behavior is normal. Judgment and thought content normal.    MAU Course  Procedures (including critical care time)  Labs Reviewed  URINALYSIS, ROUTINE W REFLEX MICROSCOPIC  HCG, QUANTITATIVE, PREGNANCY   No results found. Results for orders placed or performed during the hospital encounter of 06/07/18 (from the past 24 hour(s))  Urinalysis, Routine w reflex microscopic     Status: Abnormal   Collection Time: 06/07/18  1:36 PM  Result Value Ref Range   Color, Urine YELLOW YELLOW   APPearance CLEAR CLEAR   Specific Gravity, Urine 1.014 1.005 - 1.030   pH 7.0 5.0 - 8.0   Glucose, UA NEGATIVE NEGATIVE mg/dL   Hgb urine dipstick SMALL (A) NEGATIVE   Bilirubin Urine NEGATIVE NEGATIVE   Ketones, ur NEGATIVE NEGATIVE mg/dL   Protein, ur NEGATIVE NEGATIVE mg/dL   Nitrite NEGATIVE NEGATIVE   Leukocytes, UA NEGATIVE NEGATIVE   RBC / HPF 0-5 0 - 5 RBC/hpf   WBC, UA 0-5 0 - 5 WBC/hpf   Bacteria, UA NONE SEEN NONE SEEN   Squamous Epithelial / LPF 0-5 0 - 5   Mucus PRESENT   hCG, quantitative, pregnancy     Status:  Abnormal   Collection Time: 06/07/18  1:54 PM  Result Value Ref Range   hCG, Beta Chain, Quant, S 779 (H) <5 mIU/mL    1. Abdominal pain in pregnancy, first trimester       MDM  Vital signs are stable.  Ultrasound that was done on June 18 showed intrauterine gestational sac and yolk sac present.  With exam there is no vaginal bleeding noted whatsoever.  We will repeat her quant.  Pt declines any intervention at this time Discussed those findings and discharge her home.

## 2018-06-10 ENCOUNTER — Encounter: Payer: Self-pay | Admitting: Obstetrics & Gynecology

## 2018-06-10 ENCOUNTER — Ambulatory Visit (INDEPENDENT_AMBULATORY_CARE_PROVIDER_SITE_OTHER): Payer: Self-pay | Admitting: General Practice

## 2018-06-10 ENCOUNTER — Ambulatory Visit (HOSPITAL_COMMUNITY)
Admission: RE | Admit: 2018-06-10 | Discharge: 2018-06-10 | Disposition: A | Discharge status: Home / Self Care | Payer: Self-pay | Source: Ambulatory Visit | Attending: Student | Admitting: Student

## 2018-06-10 DIAGNOSIS — O3411 Maternal care for benign tumor of corpus uteri, first trimester: Secondary | ICD-10-CM | POA: Insufficient documentation

## 2018-06-10 DIAGNOSIS — D259 Leiomyoma of uterus, unspecified: Secondary | ICD-10-CM | POA: Insufficient documentation

## 2018-06-10 DIAGNOSIS — Z712 Person consulting for explanation of examination or test findings: Secondary | ICD-10-CM

## 2018-06-10 DIAGNOSIS — Z3A01 Less than 8 weeks gestation of pregnancy: Secondary | ICD-10-CM | POA: Insufficient documentation

## 2018-06-10 DIAGNOSIS — R109 Unspecified abdominal pain: Secondary | ICD-10-CM

## 2018-06-10 DIAGNOSIS — O3410 Maternal care for benign tumor of corpus uteri, unspecified trimester: Secondary | ICD-10-CM

## 2018-06-10 NOTE — Progress Notes (Signed)
Patient presents to office today for viability ultrasound results. Reviewed ultrasound with Dr Harolyn Rutherford who finds living IUP, patient should begin PNV and start OB care.  Informed patient of results, provided pictures, & reviewed dating. Patient verbalized understanding and reports concern over why she is still spotting. Discussed with patient spotting in early pregnancy can be common but nothing was seen on the ultrasound to indicate why she would be spotting & no infection was seen on previous exams. Reassured patient that pregnancy/baby has continued to progress since last ultrasound. Discussed if she begins having heavier bleeding like a period or severe pain she should return to MAU for evaluation. Patient verbalized understanding. Recommended she continue taking PNV and start OB care. Patient verbalized understanding & had no questions.

## 2018-06-11 NOTE — Progress Notes (Signed)
I have reviewed the chart and agree with nursing staff's documentation of this patient's encounter.  Verita Schneiders, MD 06/11/2018 8:59 AM

## 2019-01-01 IMAGING — US US OB COMP LESS 14 WK
1 series · 15 of 28 positions shown · non-contrast
Comparison: None.

CLINICAL DATA: Left-sided abdominal and pelvic pain 1 week.
Endometriosis. Gestational age by LMP of 5 weeks 3 days.

EXAM:
OBSTETRIC <14 WK US AND TRANSVAGINAL OB US
TECHNIQUE: Both transabdominal and transvaginal ultrasound examinations were
performed for complete evaluation of the gestation as well as the
maternal uterus, adnexal regions, and pelvic cul-de-sac.
Transvaginal technique was performed to assess early pregnancy.

[Series 1: us ob comp less 14 wk · 15 of 39 slices shown]
[im 1/39]
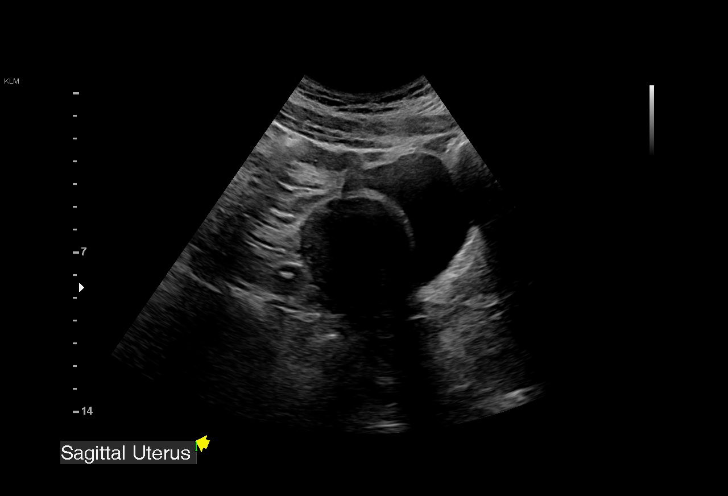
[im 3/39]
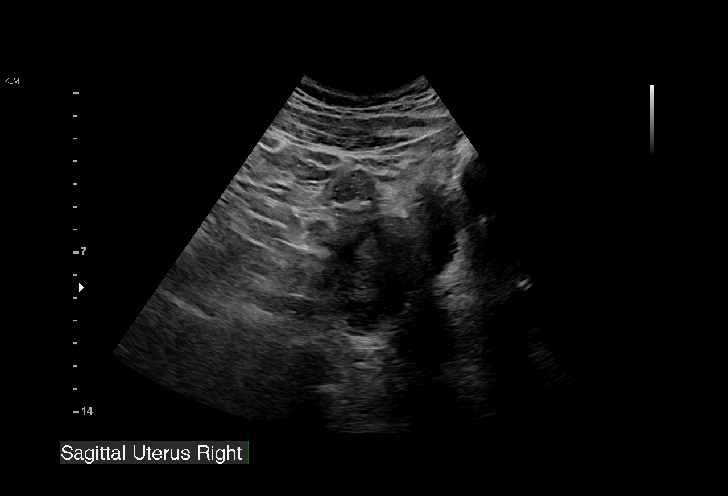
[im 6/39]
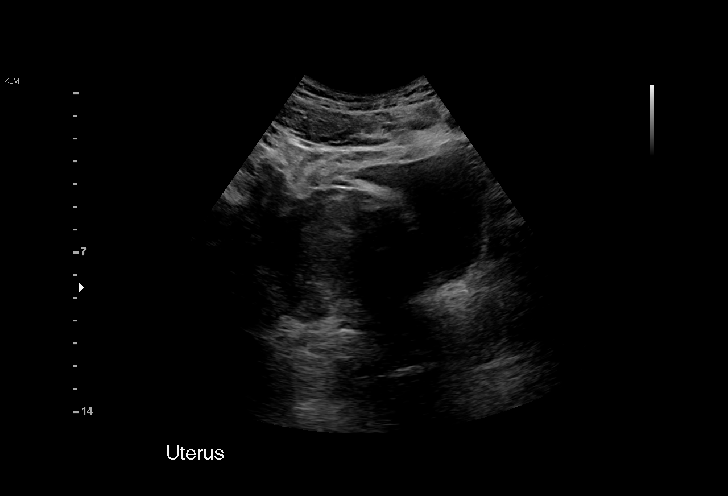
[im 9/39]
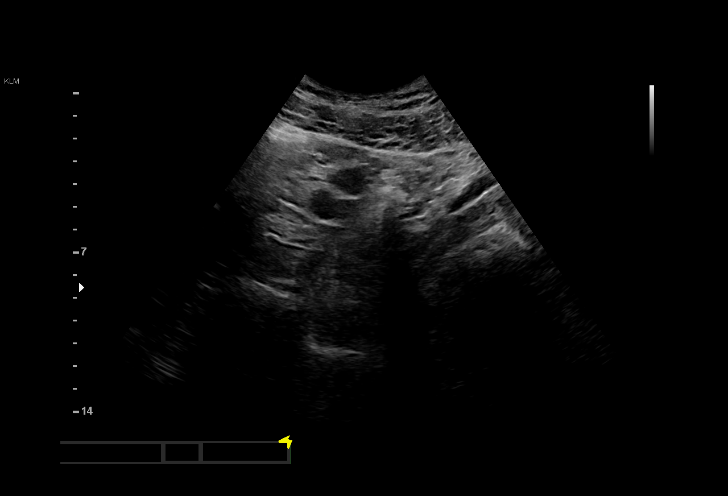
[im 12/39]
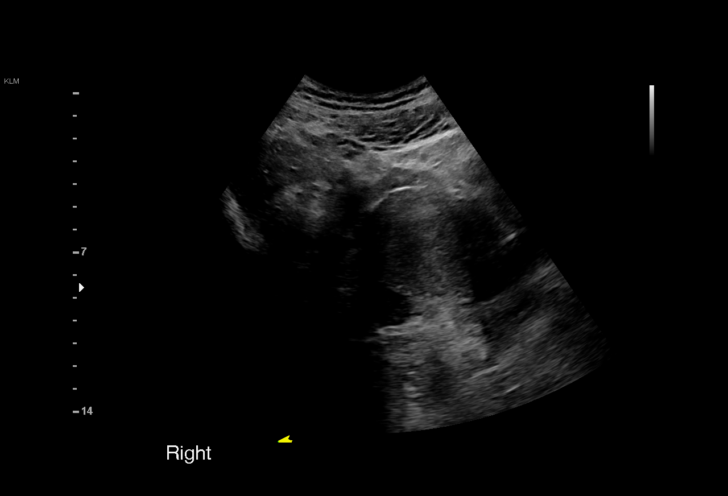
[im 15/39]
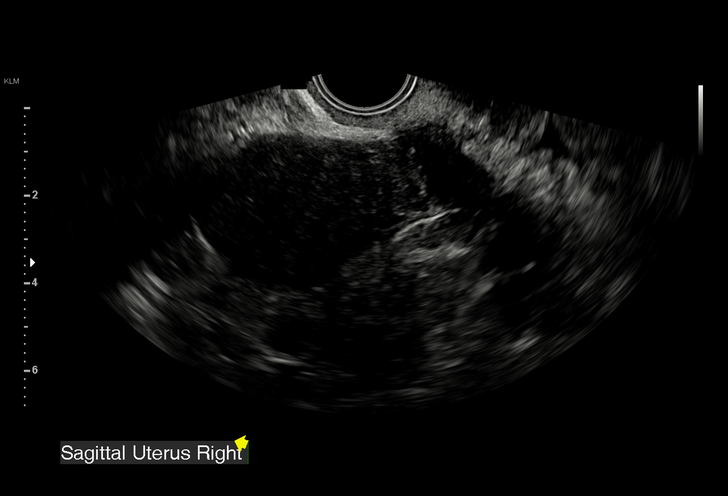
[im 17/39]
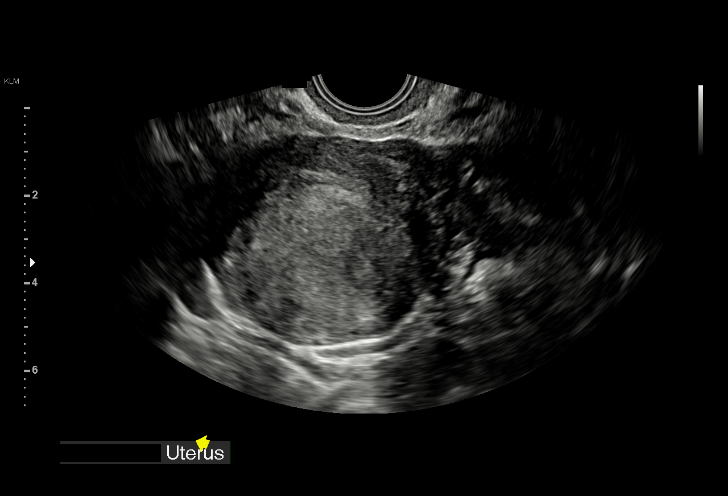
[im 20/39]
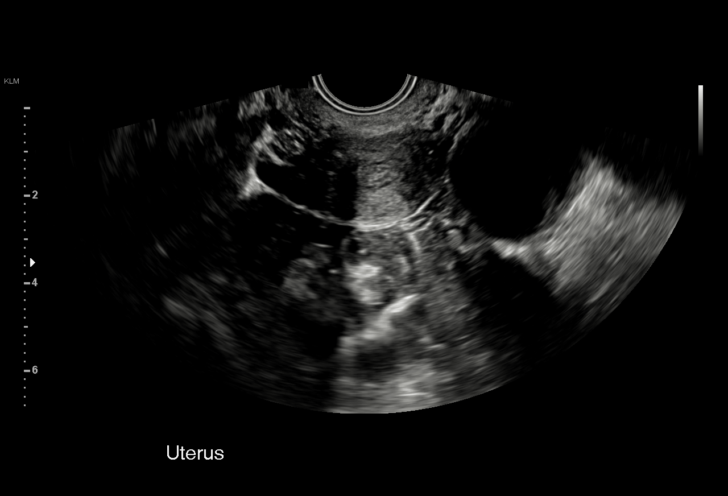
[im 22/39]
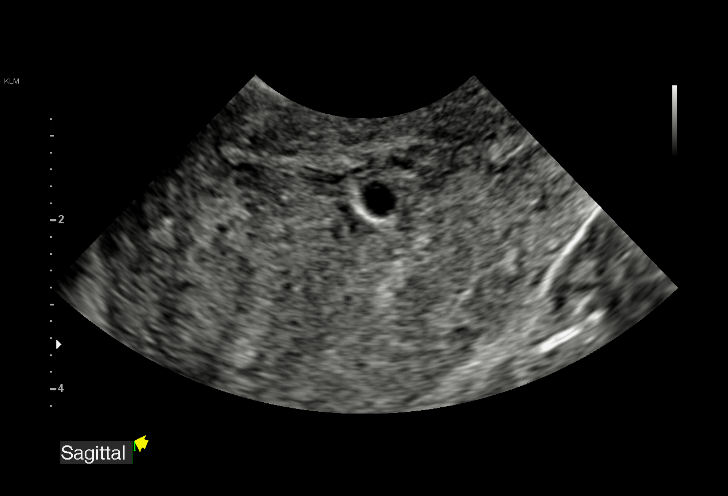
[im 24/39]
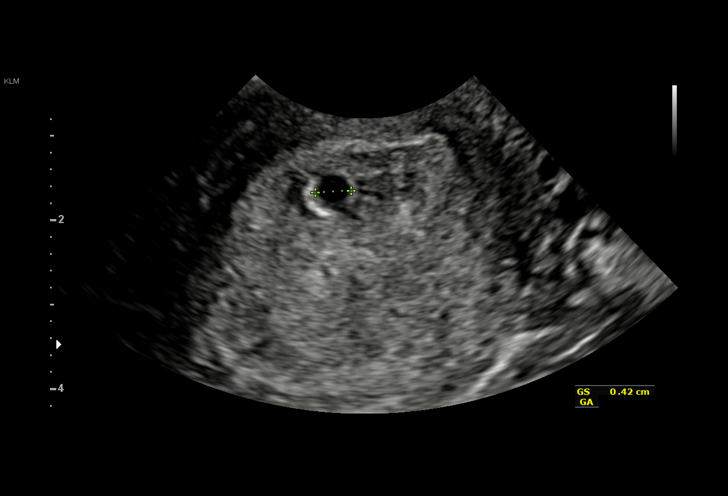
[im 27/39]
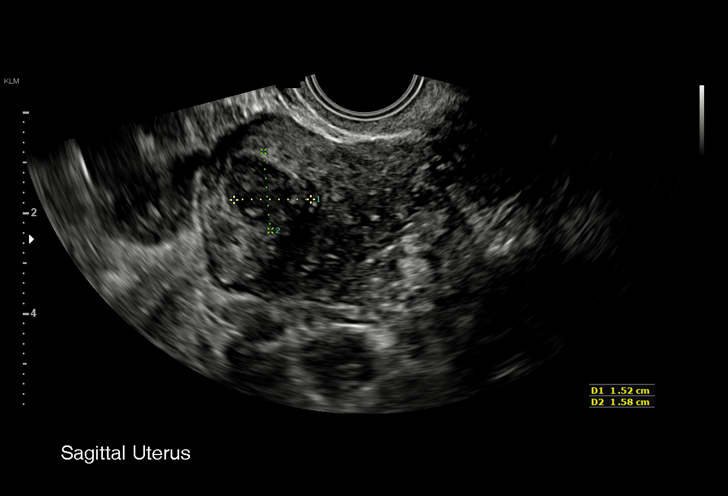
[im 30/39]
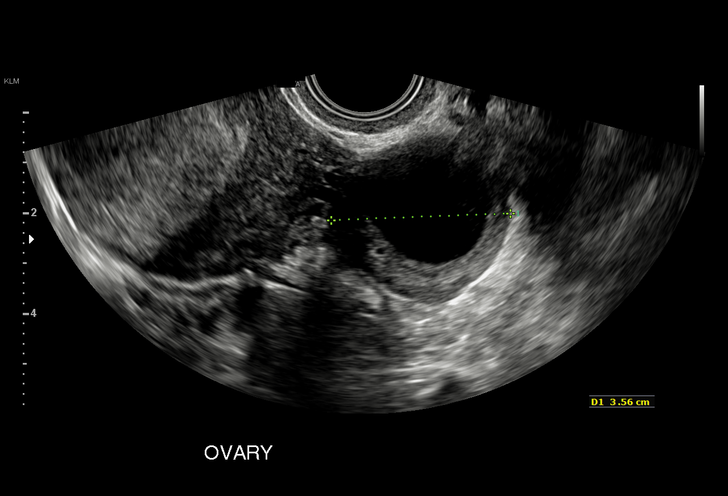
[im 33/39]
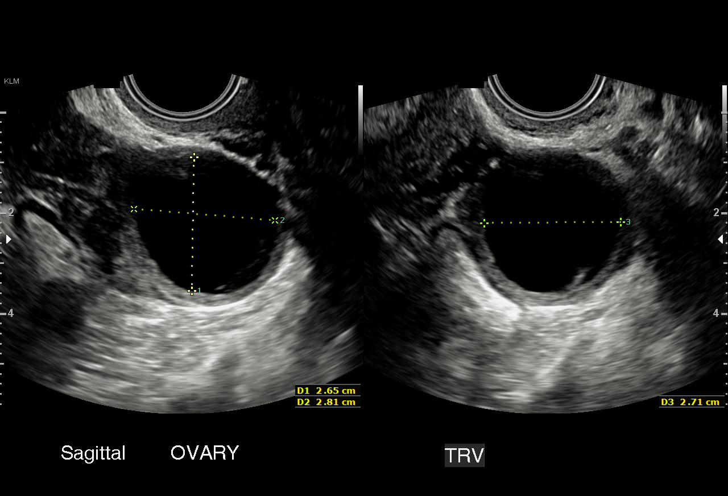
[im 36/39]
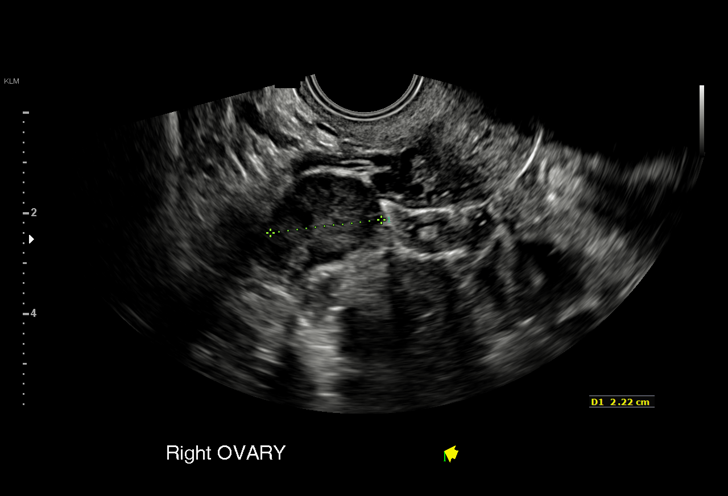
[im 39/39]
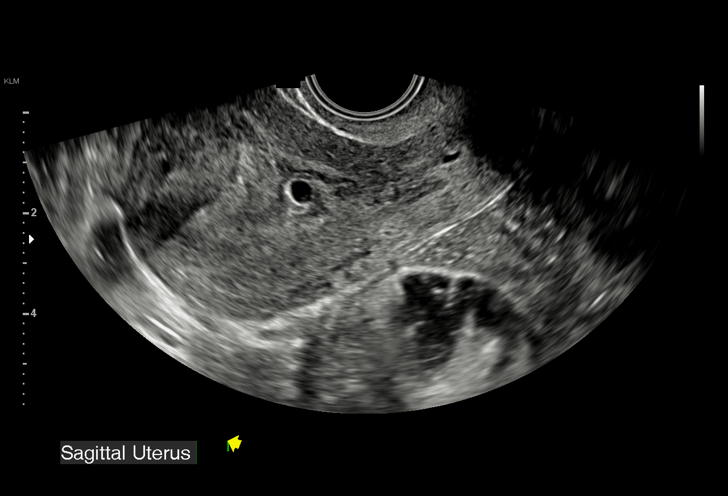

[15 of 28 positions shown; findings below may reference images not displayed]

FINDINGS: Intrauterine gestational sac: Single

Yolk sac:  Visualized.

Embryo:  Not Visualized.

MSD: 5 mm   5 w   1 d

Subchorionic hemorrhage:  None visualized.

Maternal uterus/adnexae: A 1.7 cm fibroid is seen in the left
uterine fundus. Left ovarian corpus luteum cyst is seen measuring
2.8 cm. Right ovary is normal appearance. No adnexal mass or
abnormal free fluid identified.
IMPRESSION: Single early approximately 5 week intrauterine gestational sac.
Consider following serial b-hCG levels, with followup ultrasound to
assess viability in 10-14 days.

Small uterine fibroid.

## 2019-01-14 IMAGING — US US OB TRANSVAGINAL
1 series · 15 of 28 positions shown · non-contrast
Comparison: 06/03/2018

CLINICAL DATA: Pregnant, for viability

EXAM:
TRANSVAGINAL OB ULTRASOUND
TECHNIQUE: Transvaginal ultrasound was performed for complete evaluation of the
gestation as well as the maternal uterus, adnexal regions, and
pelvic cul-de-sac.

[Series 1: us ob transvaginal · 15 of 36 slices shown]
[im 1/36]
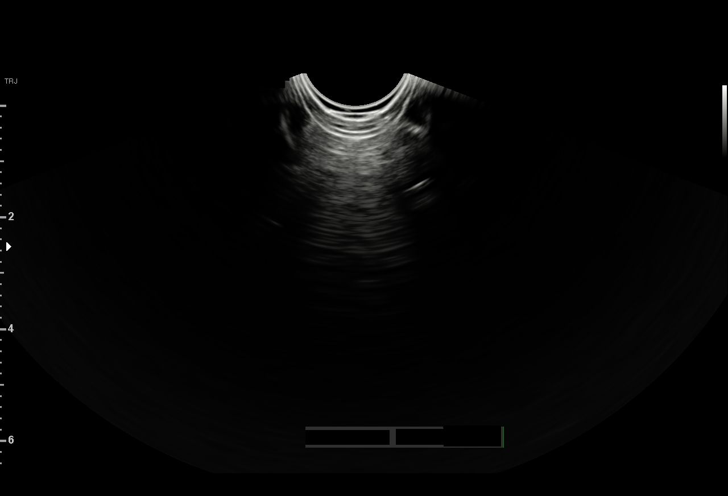
[im 3/36]
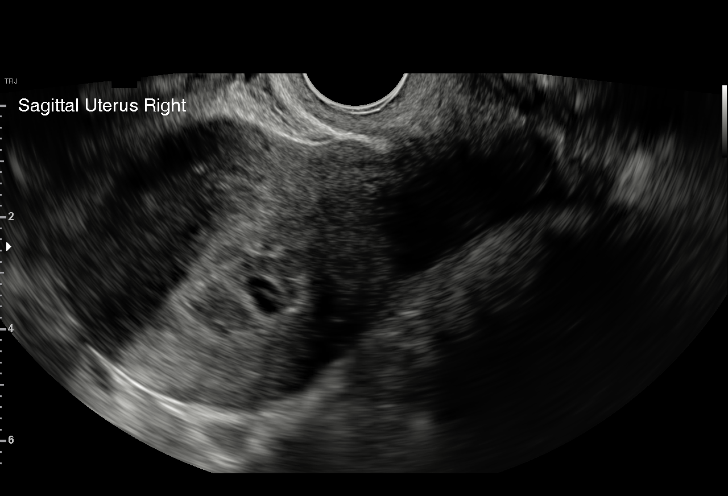
[im 6/36]
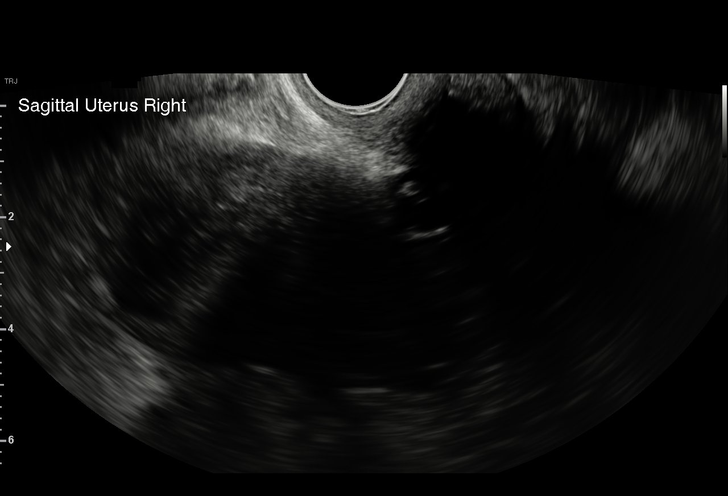
[im 8/36]
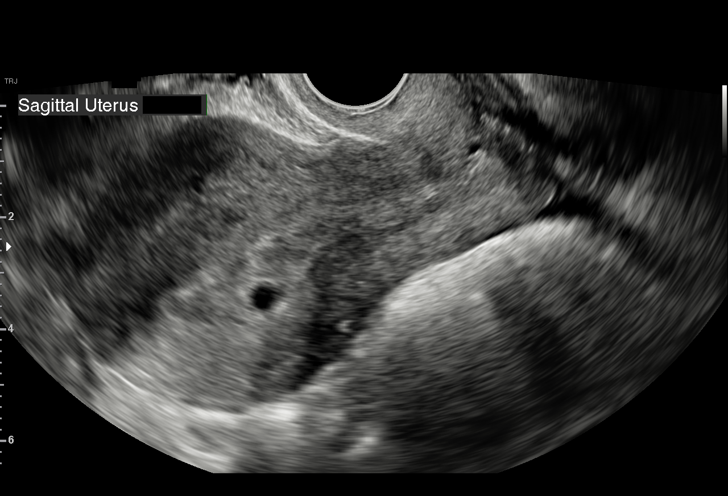
[im 11/36]
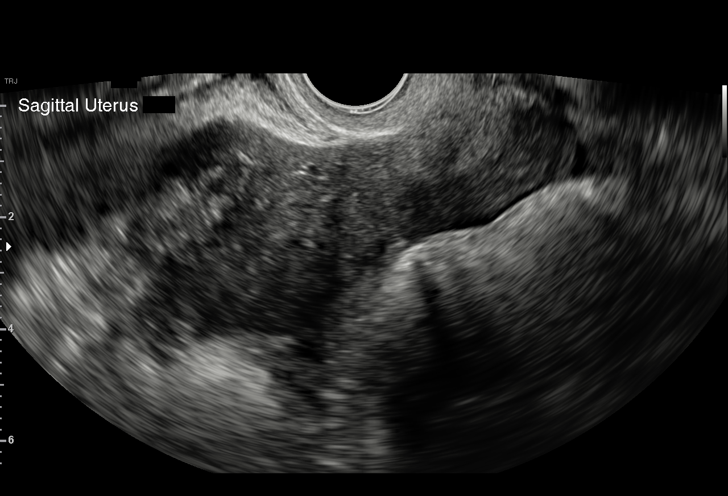
[im 13/36]
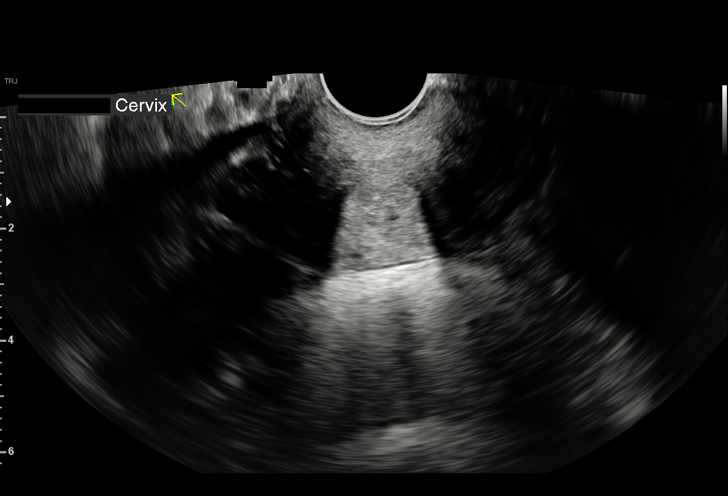
[im 16/36]
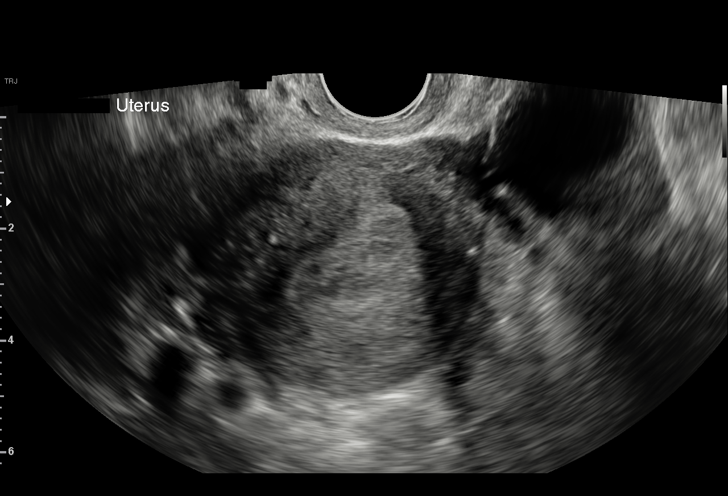
[im 19/36]
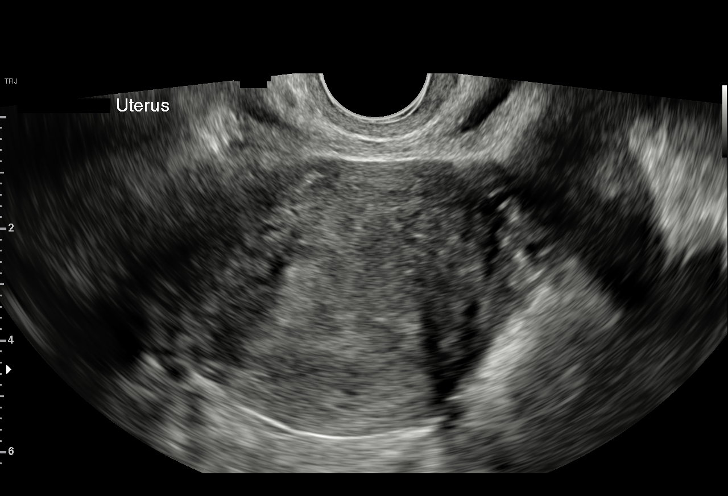
[im 20/36]
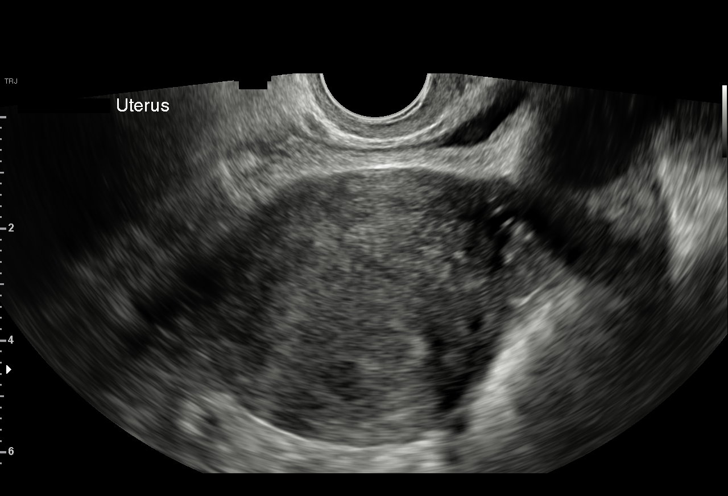
[im 23/36]
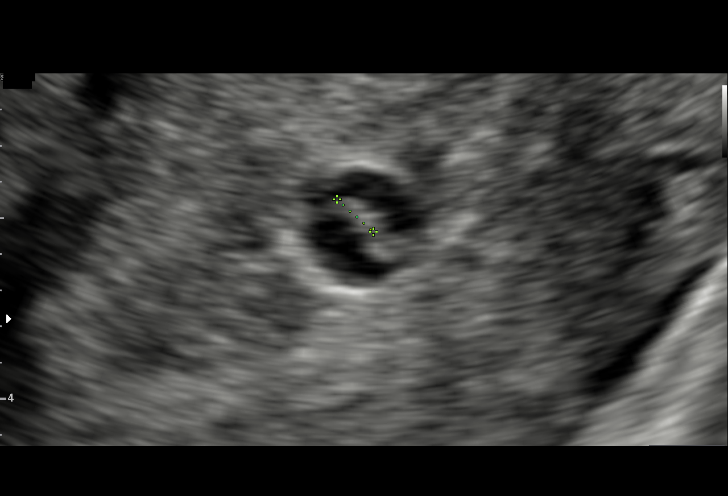
[im 25/36]
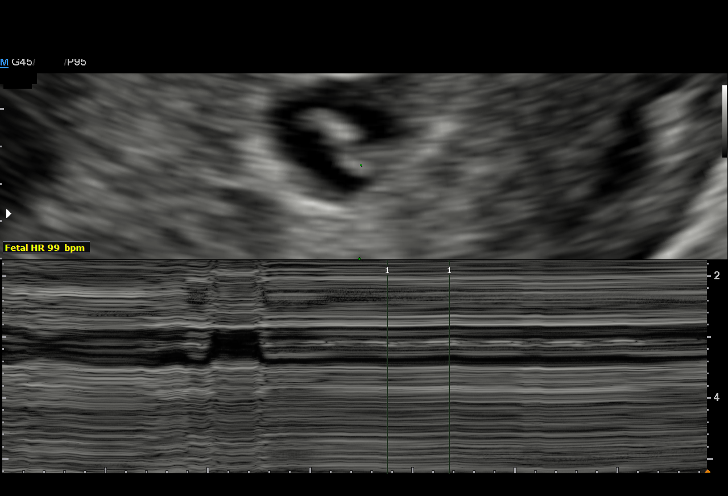
[im 28/36]
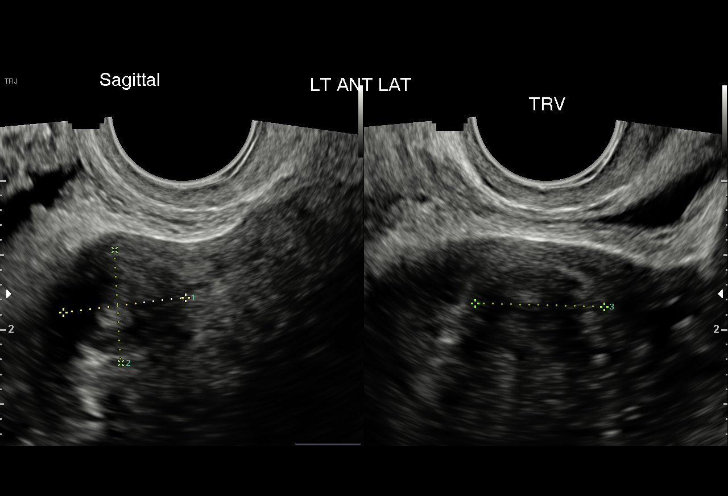
[im 30/36]
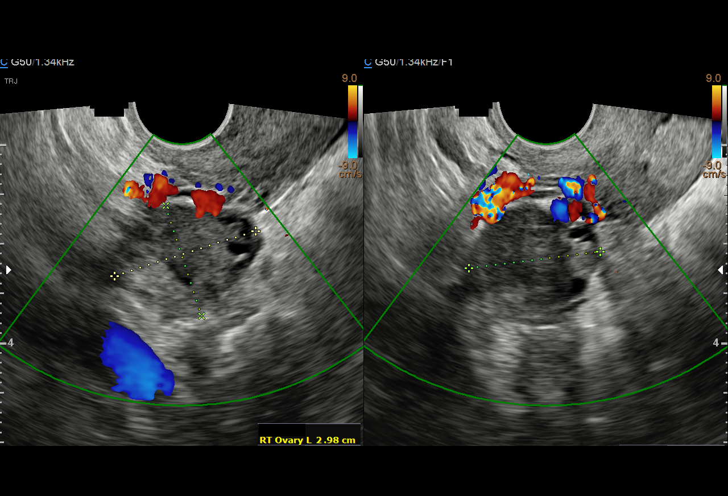
[im 33/36]
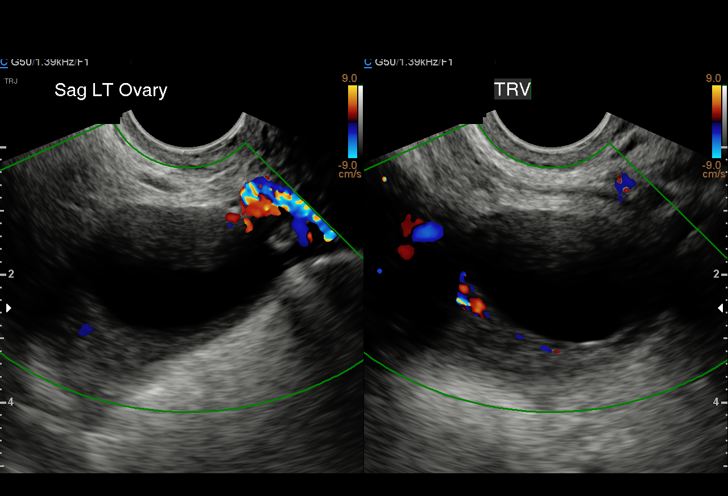
[im 36/36]
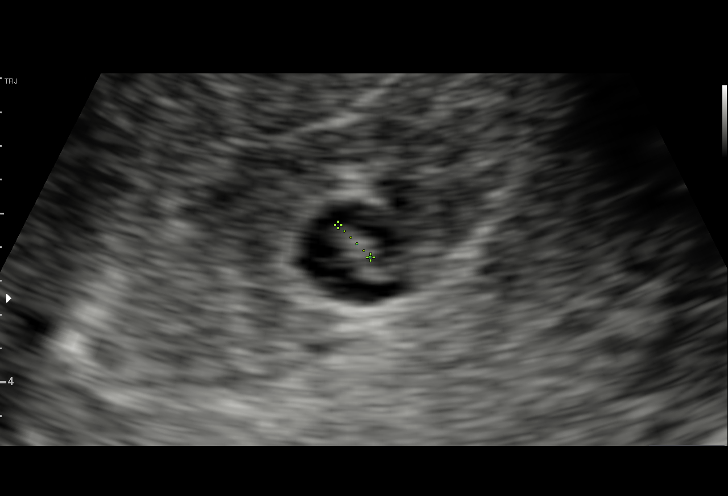

[15 of 28 positions shown; findings below may reference images not displayed]

FINDINGS: Intrauterine gestational sac: Single

Yolk sac:  Visualized.

Embryo:  Visualized.

Cardiac Activity: Visualized.

Heart Rate: 102 bpm

CRL:   2.7 mm   5 w 5 d                  US EDC: 02/05/2019

Subchorionic hemorrhage:  None visualized.

Maternal uterus/adnexae: Right ovary is within normal limits. Left
ovary is notable for a 3.1 cm simple corpus luteal cyst/follicle.

Suspected 1.7 cm left anterior uterine fibroid, poorly visualized.

Trace pelvic fluid.
IMPRESSION: Single live intrauterine gestation, measuring 5 weeks 5 days by
crown-rump length, as above.

## 2019-04-19 ENCOUNTER — Encounter (HOSPITAL_COMMUNITY): Payer: Self-pay

## 2020-03-16 ENCOUNTER — Ambulatory Visit (HOSPITAL_COMMUNITY): Admission: EM | Admit: 2020-03-16 | Discharge: 2020-03-16 | Discharge status: Against Medical Advice | Payer: Self-pay

## 2020-03-16 ENCOUNTER — Other Ambulatory Visit: Payer: Self-pay

## 2020-03-16 NOTE — ED Notes (Signed)
Pt told front desk she was here for chest pain, when I asked before the triage she denies any chest pain and said she is here for physical clearance for employment.

## 2020-03-21 ENCOUNTER — Emergency Department (HOSPITAL_COMMUNITY)
Admission: EM | Admit: 2020-03-21 | Discharge: 2020-03-22 | Discharge status: Against Medical Advice | Payer: Self-pay | Attending: Emergency Medicine | Admitting: Emergency Medicine

## 2020-03-21 ENCOUNTER — Encounter (HOSPITAL_COMMUNITY): Payer: Self-pay

## 2020-03-21 ENCOUNTER — Emergency Department (HOSPITAL_COMMUNITY): Payer: Self-pay

## 2020-03-21 ENCOUNTER — Other Ambulatory Visit: Payer: Self-pay

## 2020-03-21 DIAGNOSIS — Z5321 Procedure and treatment not carried out due to patient leaving prior to being seen by health care provider: Secondary | ICD-10-CM | POA: Insufficient documentation

## 2020-03-21 LAB — CBC
HCT: 40.4 % (ref 36.0–46.0)
Hemoglobin: 12.6 g/dL (ref 12.0–15.0)
MCH: 25.9 pg — ABNORMAL LOW (ref 26.0–34.0)
MCHC: 31.2 g/dL (ref 30.0–36.0)
MCV: 83.1 fL (ref 80.0–100.0)
Platelets: 272 10*3/uL (ref 150–400)
RBC: 4.86 MIL/uL (ref 3.87–5.11)
RDW: 15.7 % — ABNORMAL HIGH (ref 11.5–15.5)
WBC: 9.1 10*3/uL (ref 4.0–10.5)
nRBC: 0 % (ref 0.0–0.2)

## 2020-03-21 LAB — I-STAT BETA HCG BLOOD, ED (MC, WL, AP ONLY): I-stat hCG, quantitative: <5 m[IU]/mL (ref <5)

## 2020-03-21 MED ORDER — SODIUM CHLORIDE 0.9% FLUSH: 3.0000 mL | Freq: Once | INTRAVENOUS | Status: DC

## 2020-03-21 NOTE — ED Triage Notes (Signed)
Pt arrives to ED w/ c/o chest pain x 3 days. Pt endorses intermittent SOB. Pt states 9/10 pain. Pt denies n/v

## 2020-03-22 LAB — BASIC METABOLIC PANEL
Anion gap: 8 (ref 5–15)
BUN: 15 mg/dL (ref 6–20)
CO2: 20 mmol/L — ABNORMAL LOW (ref 22–32)
Calcium: 8.6 mg/dL — ABNORMAL LOW (ref 8.9–10.3)
Chloride: 106 mmol/L (ref 98–111)
Creatinine, Ser: 0.99 mg/dL (ref 0.44–1.00)
GFR calc Af Amer: >60 mL/min (ref >60)
GFR calc non Af Amer: >60 mL/min (ref >60)
Glucose, Bld: 107 mg/dL — ABNORMAL HIGH (ref 70–99)
Potassium: 3.3 mmol/L — ABNORMAL LOW (ref 3.5–5.1)
Sodium: 134 mmol/L — ABNORMAL LOW (ref 135–145)

## 2020-03-22 LAB — TROPONIN I (HIGH SENSITIVITY)
Troponin I (High Sensitivity): <2 ng/L (ref <18)
Troponin I (High Sensitivity): <2 ng/L (ref <18)

## 2020-03-22 NOTE — ED Notes (Signed)
Pt is leaving due to wait time

## 2020-10-25 IMAGING — DX DG CHEST 2V
2 series · 2 of 2 positions shown · non-contrast
Comparison: None.

CLINICAL DATA: Chest pain.

EXAM:
CHEST - 2 VIEW

[chest pa]
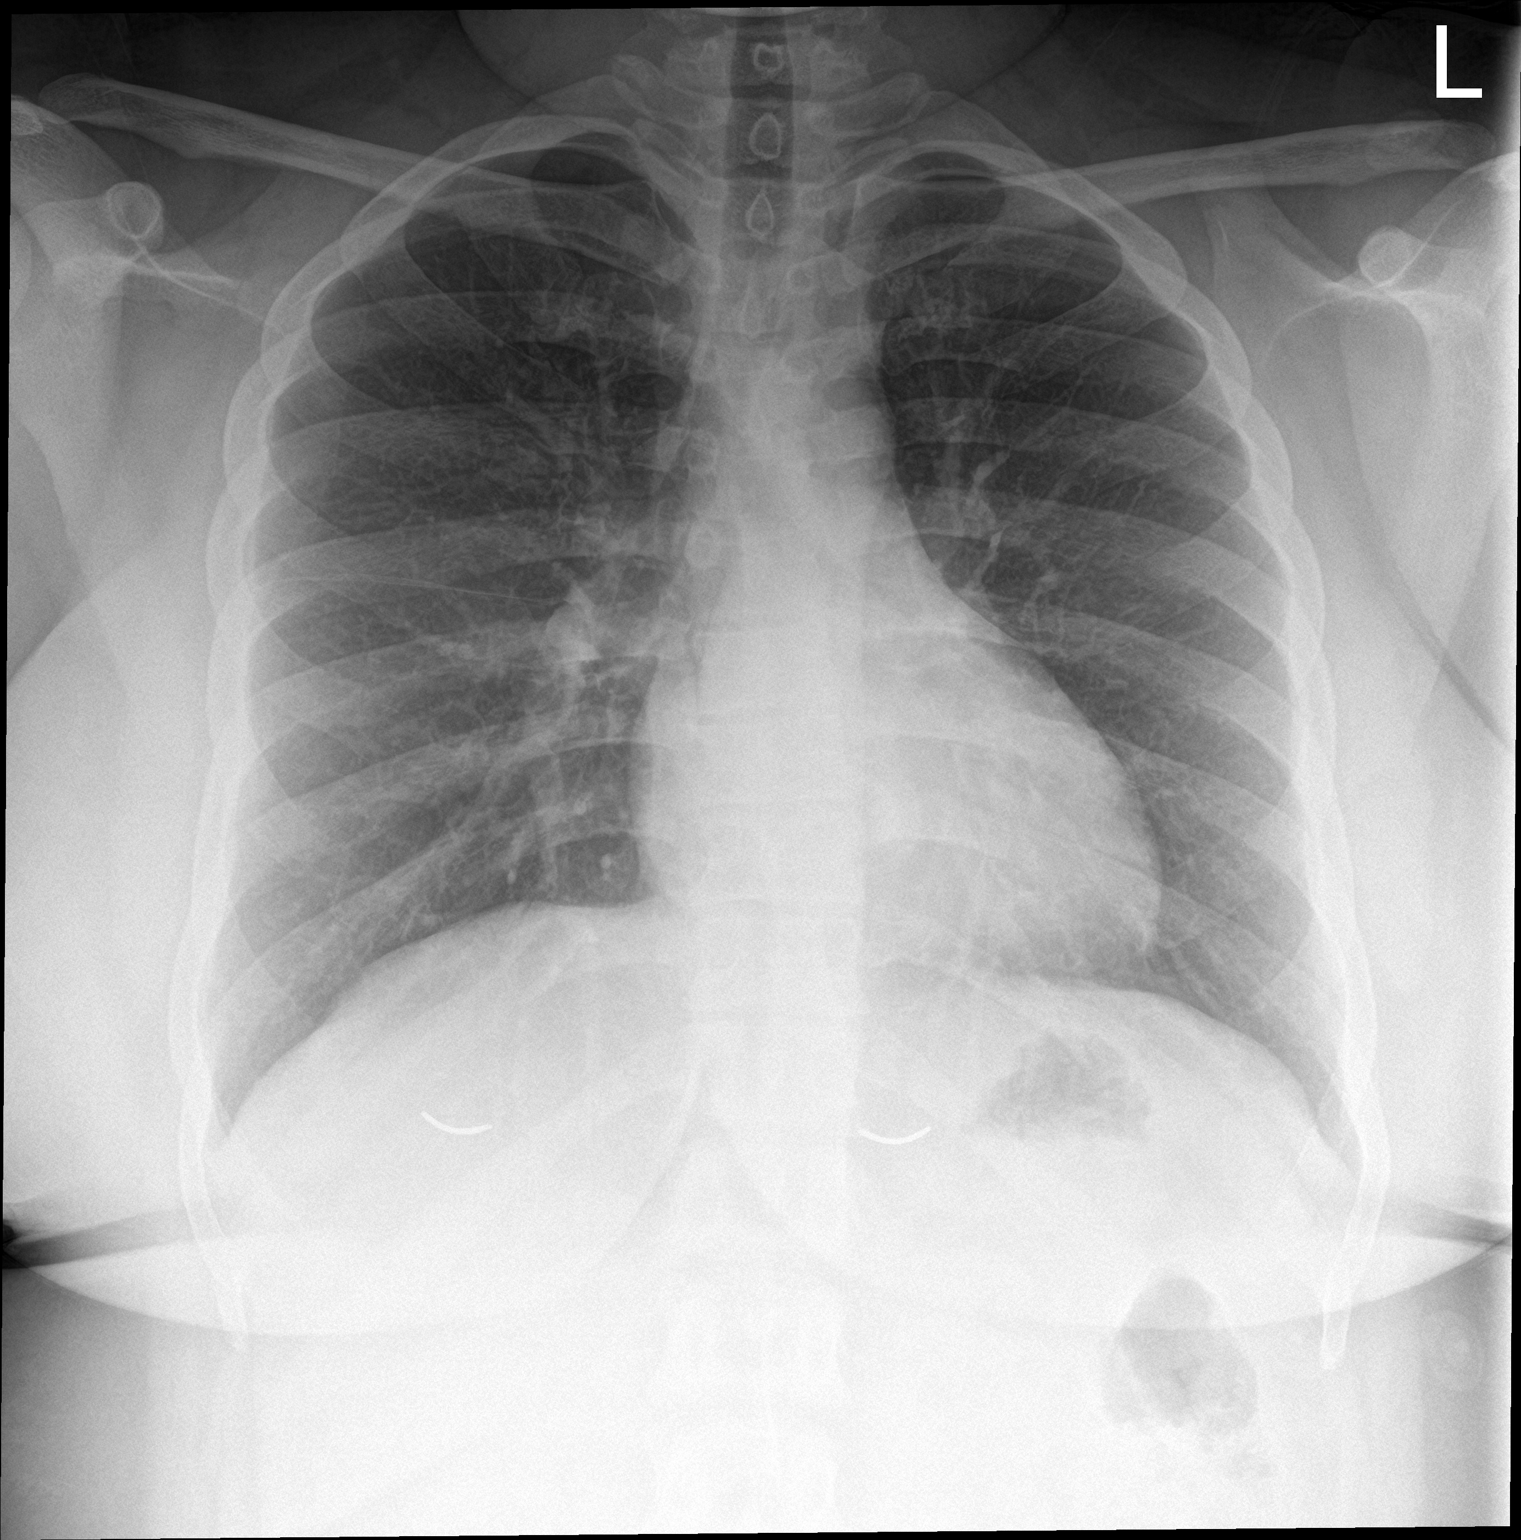

[chest lat]
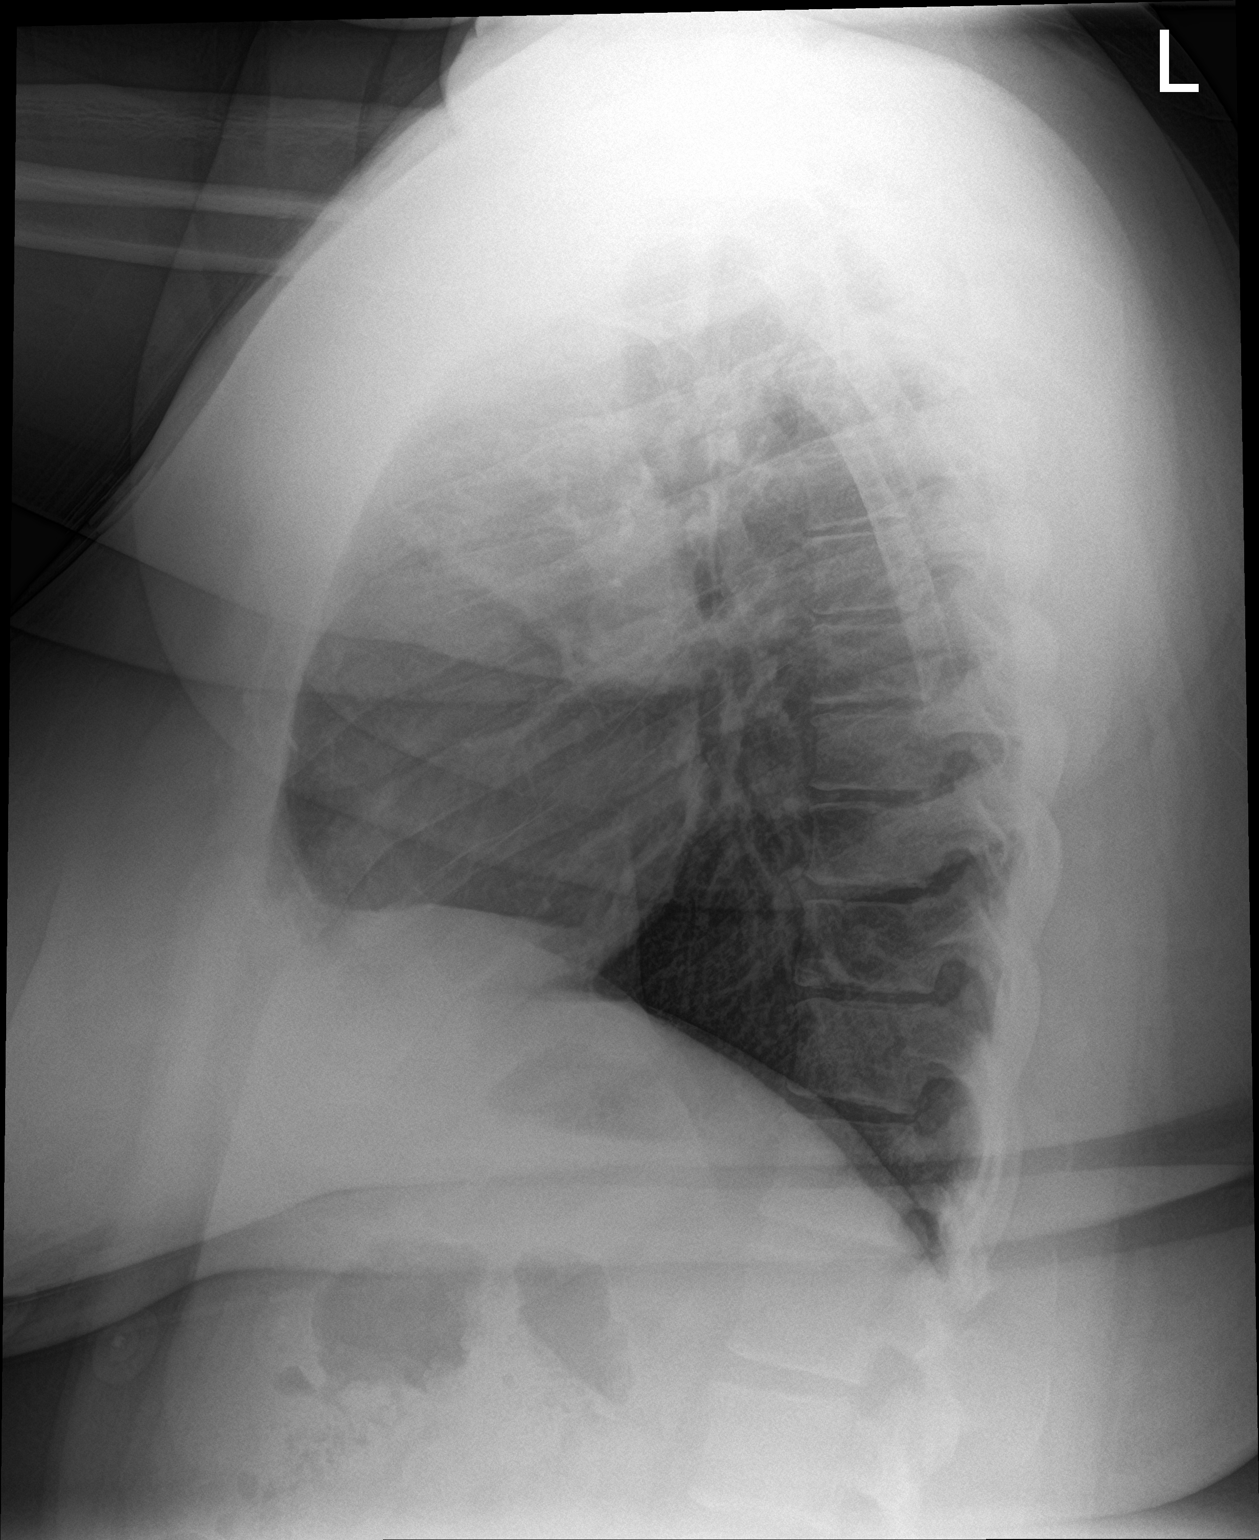

[2 of 2 positions shown; findings below may reference images not displayed]

FINDINGS: The heart size and mediastinal contours are within normal limits.
Both lungs are clear. The visualized skeletal structures are
unremarkable.
IMPRESSION: No active cardiopulmonary disease.

## 2021-06-26 ENCOUNTER — Ambulatory Visit (HOSPITAL_COMMUNITY)
Admission: EM | Admit: 2021-06-26 | Discharge: 2021-06-26 | Disposition: A | Discharge status: Home / Self Care | Payer: BC Managed Care – PPO

## 2021-06-26 ENCOUNTER — Other Ambulatory Visit: Payer: Self-pay

## 2021-06-26 ENCOUNTER — Encounter (HOSPITAL_COMMUNITY): Payer: Self-pay

## 2021-06-26 DIAGNOSIS — G44209 Tension-type headache, unspecified, not intractable: Secondary | ICD-10-CM

## 2021-06-26 DIAGNOSIS — Z0289 Encounter for other administrative examinations: Secondary | ICD-10-CM | POA: Diagnosis not present

## 2021-06-26 NOTE — ED Triage Notes (Signed)
Pt in with c/o headache x 3 days  Pt states she has been working a lot lately, 22 days in a row and is feeling really tired  Pt has been taking advil with no relief

## 2021-06-26 NOTE — Discharge Instructions (Addendum)
-  For pain- Take Tylenol 1000 mg 3 times daily, and advil (ibuprofen) 800 mg 3 times daily with food.  You can take these together, or alternate every 3-4 hours.   -Seek additional immediate medical attention if you develop the worst headache of your life, especially if this is associated with vision changes, dizziness, weakness. -Drink plenty of water and get plenty of rest!

## 2021-06-26 NOTE — ED Provider Notes (Signed)
[place]    CSN: [id number] Arrival date & time: 06/26/21  1321      History   Chief Complaint Chief Complaint  Patient presents with   Headache   Fatigue    HPI Clarie Camey is a 30 y.o. female presenting with headache for 3 days, not relieved by Tylenol and Advil, and fatigue.  Medical history morbid obesity.  Throbbing headache behind forehead. Notes stressors lately including working 22 days in a row. Denies worst headache of life, thunderclap headache, weakness/sensation changes in arms/legs, vision changes, shortness of breath, chest pain/pressure, photophobia, phonophobia, n/v/d.    HPI  Past Medical History:  Diagnosis Date   At risk for infertility 2018   Fibroid    Morbid obesity ([place])     There are no problems to display for this patient.   Past Surgical History:  Procedure Laterality Date   APPENDECTOMY      OB History     Gravida  1   Para      Term      Preterm      AB      Living         SAB      IAB      Ectopic      Multiple      Live Births               Home Medications    Prior to Admission medications   Medication Sig Start Date End Date Taking? Authorizing Provider  cephALEXin (KEFLEX) 500 MG capsule Take 1 capsule (500 mg total) by mouth 4 (four) times daily. 06/05/18   Jorje Guild, NP  docusate sodium (COLACE) 100 MG capsule Take 1 capsule (100 mg total) by mouth 3 (three) times daily. 05/28/18   Starr Lake, CNM  phentermine (ADIPEX-P) 37.5 MG tablet Take 37.5 mg by mouth daily. 05/12/21   [provider]  polyethylene glycol (MIRALAX) packet Take 17 g by mouth daily. 05/28/18   Ok Edwards, PA-C    Family History Family History  Problem Relation Age of Onset   Healthy Mother    Anxiety disorder Neg Hx    Birth defects Neg Hx    Cancer Neg Hx    COPD Neg Hx    Depression Neg Hx    Diabetes Neg Hx    Drug abuse Neg Hx    Early death Neg Hx    Heart disease Neg Hx     Hearing loss Neg Hx    ADD / ADHD Neg Hx    Alcohol abuse Neg Hx    Arthritis Neg Hx    Asthma Neg Hx    Hypertension Neg Hx    Hyperlipidemia Neg Hx    Intellectual disability Neg Hx    Kidney disease Neg Hx    Learning disabilities Neg Hx    Miscarriages / Stillbirths Neg Hx    Obesity Neg Hx    Stroke Neg Hx    Vision loss Neg Hx    Varicose Veins Neg Hx     Social History Social History   Tobacco Use   Smoking status: Every Day    Packs/day: 0.25    Pack years: 0.00    Types: Cigarettes   Smokeless tobacco: Never  Substance Use Topics   Alcohol use: Yes    Comment: social; not while preg   Drug use: Yes    Types: Marijuana    Comment: last use  28 May 2018     Allergies   Patient has no known allergies.   Review of Systems Review of Systems  Constitutional:  Positive for fatigue. Negative for appetite change, chills and fever.  HENT:  Negative for congestion, sinus pressure, sore throat, trouble swallowing and voice change.   Eyes:  Negative for photophobia, pain, discharge, redness, itching and visual disturbance.  Respiratory:  Negative for cough, chest tightness and shortness of breath.   Cardiovascular:  Negative for chest pain, palpitations and leg swelling.  Gastrointestinal:  Negative for abdominal pain, constipation, diarrhea, nausea and vomiting.  Genitourinary:  Negative for dysuria, flank pain, frequency and urgency.  Musculoskeletal:  Negative for back pain, gait problem, myalgias, neck pain and neck stiffness.  Neurological:  Positive for headaches. Negative for dizziness, tremors, seizures, syncope, facial asymmetry, speech difficulty, weakness, light-headedness and numbness.  Psychiatric/Behavioral:  Negative for agitation, decreased concentration, dysphoric mood, hallucinations and suicidal ideas. The patient is not nervous/anxious.   All other systems reviewed and are negative.   Physical Exam Triage Vital Signs ED Triage Vitals  Enc  Vitals Group     BP 06/26/21 1437 118/84     Pulse Rate 06/26/21 1437 91     Resp 06/26/21 1437 19     Temp 06/26/21 1437 98.3 F (36.8 C)     Temp Source 06/26/21 1437 Oral     SpO2 06/26/21 1437 97 %     Weight --      Height --      Head Circumference --      Peak Flow --      Pain Score 06/26/21 1442 6     Pain Loc --      Pain Edu? --      Excl. in [place]? --    No data found.  Updated Vital Signs BP 118/84 (BP Location: Left Arm)   Pulse 91   Temp 98.3 F (36.8 C) (Oral)   Resp 19   LMP 06/07/2021 (Exact Date)   SpO2 97%   Visual Acuity Right Eye Distance:   Left Eye Distance:   Bilateral Distance:    Right Eye Near:   Left Eye Near:    Bilateral Near:     Physical Exam Vitals reviewed.  Constitutional:      General: She is not in acute distress.    Appearance: Normal appearance. She is not ill-appearing.  HENT:     Head: Normocephalic and atraumatic.  Eyes:     Extraocular Movements: Extraocular movements intact.     Pupils: Pupils are equal, round, and reactive to light.  Cardiovascular:     Rate and Rhythm: Normal rate and regular rhythm.     Heart sounds: Normal heart sounds.  Pulmonary:     Effort: Pulmonary effort is normal.     Breath sounds: Normal breath sounds. No wheezing, rhonchi or rales.  Musculoskeletal:     Cervical back: Normal range of motion and neck supple. No rigidity.  Lymphadenopathy:     Cervical: No cervical adenopathy.  Skin:    Capillary Refill: Capillary refill takes less than 2 seconds.  Neurological:     General: No focal deficit present.     Mental Status: She is alert and oriented to person, place, and time. Mental status is at baseline.     Cranial Nerves: Cranial nerves are intact. No cranial nerve deficit or facial asymmetry.     Sensory: Sensation is intact. No sensory deficit.  Motor: Motor function is intact. No weakness.     Coordination: Coordination is intact. Coordination normal.     Gait: Gait is intact.  Gait normal.     Comments: EOMI, PERRLA, CN 2-12 intact. No weakness or numbness in UEs or LEs. Negative rhomberg and pronator drift. Grip strength 5/5, gait intact.  Psychiatric:        Mood and Affect: Mood normal.        Behavior: Behavior normal.        Thought Content: Thought content normal.        Judgment: Judgment normal.     UC Treatments / Results  Labs (all labs ordered are listed, but only abnormal results are displayed) Labs Reviewed - No data to display  EKG   Radiology No results found.  Procedures Procedures (including critical care time)  Medications Ordered in UC Medications - No data to display  Initial Impression / Assessment and Plan / UC Course  I have reviewed the triage vital signs and the nursing notes.  Pertinent labs & imaging results that were available during my care of the patient were reviewed by me and considered in my medical decision making (see chart for details).     This patient is a very pleasant 30 y.o. year old female presenting with tension headache. No red flag symptoms. States she is not pregnant. Declines Toradol today in favor or PO advil. Work note provided. ED return precautions discussed. Patient verbalizes understanding and agreement.    Final Clinical Impressions(s) / UC Diagnoses   Final diagnoses:  Tension headache  Encounter to obtain excuse from work     Discharge Instructions      -For pain- Take Tylenol 1000 mg 3 times daily, and advil (ibuprofen) 800 mg 3 times daily with food.  You can take these together, or alternate every 3-4 hours.   -Seek additional immediate medical attention if you develop the worst headache of your life, especially if this is associated with vision changes, dizziness, weakness. -Drink plenty of water and get plenty of rest!      ED Prescriptions   None    PDMP not reviewed this encounter.   Hazel Sams, PA-C 06/26/21 1501

## 2022-07-13 ENCOUNTER — Inpatient Hospital Stay
Admit: 2022-07-13 | Discharge: 2022-07-13 | Disposition: A | Discharge status: Home / Self Care | Attending: Emergency Medicine

## 2022-07-13 ENCOUNTER — Emergency Department: Admit: 2022-07-13

## 2022-07-13 DIAGNOSIS — O4690 Antepartum hemorrhage, unspecified, unspecified trimester: Secondary | ICD-10-CM

## 2022-07-13 NOTE — Discharge Instructions (Signed)
Return emergency department for worsening symptoms.  Take Tylenol as needed for abdominal pain, as discussed this could slightly increase your risk of autism and unborn child.  Recommend push plenty fluids.  Follow-up with OB/GYN in the next 3 days for consideration of recheck or quantitative pregnancy level.

## 2022-07-13 NOTE — ED Provider Notes (Signed)
[facility]  EMERGENCY DEPARTMENT ENCOUNTER      Pt Name: Sara Cole  MRN: [id number]  Birthdate Mar 28, 1991  Date of evaluation: 07/13/2022  Provider: Florinda Marker, MD    CHIEF COMPLAINT       Chief Complaint   Patient presents with    Vaginal Bleeding     Pt states she had a + pregnancy test at [facility] this morning, has had bleeding and cramping for a few days, LMP 06/12/22 per pt          HISTORY OF PRESENT ILLNESS   (Location/Symptom, Timing/Onset, Context/Setting, Quality, Duration, Modifying Factors, Severity)  Note limiting factors.   Sara Cole is a 31 y.o. female denies any significant history who presents to the emergency department with reported pregnancy abdominal pain.  Patient reports left lower quadrant abdominal discomfort evaluated at [facility] few hours ago proceed with positive pregnancy test hCG was reportedly less than 1000 patient informed unlikely to be able to see pregnancy on ultrasound given how early it was patient reports her last menstrual period was June 26.  Patient reports some vaginal spotting over the last 3 to 4 days but no significant bleeding.  Denies nausea vomiting lightheadedness palpitations dyspnea on exertion chest pain or shortness of breath.  Denies any urinary or bowel movement changes.  Patient had an hCG quantitative that was 367, urinalysis negative for urinary tract infection per visualized [facility] records    The history is provided by the patient.     Nursing Notes were reviewed.    REVIEW OF SYSTEMS    (2-9 systems for level 4, 10 or more for level 5)     Review of Systems   Constitutional:  Negative for fever.   HENT:  Negative for congestion.    Eyes:  Negative for visual disturbance.   Respiratory:  Negative for cough and shortness of breath.    Cardiovascular:  Negative for chest pain.   Gastrointestinal:  Positive for abdominal pain. Negative for constipation, diarrhea, nausea and vomiting.   Genitourinary:  Positive  for vaginal bleeding. Negative for dysuria and frequency.   Musculoskeletal:  Negative for arthralgias.   Skin:  Negative for rash.   Neurological:  Negative for syncope, light-headedness and headaches.   Psychiatric/Behavioral:  Negative for confusion.      Except as noted above the remainder of the review of systems was reviewed and negative.       PAST MEDICAL HISTORY   History reviewed. No pertinent past medical history.      SURGICAL HISTORY     History reviewed. No pertinent surgical history.      CURRENT MEDICATIONS       Previous Medications    No medications on file       ALLERGIES     Patient has no known allergies.    FAMILY HISTORY     No family history on file.       SOCIAL HISTORY       Social History     Socioeconomic History    Marital status: Single     Spouse name: None    Number of children: None    Years of education: None    Highest education level: None       SCREENINGS         Glasgow Coma Scale  Eye Opening: Spontaneous  Best Verbal Response: Oriented  Best Motor Response: Obeys commands  Glasgow Coma Scale Score: 15  CIWA Assessment  BP: (!) 151/97  Pulse: (!) 107                 PHYSICAL EXAM    (up to 7 for level 4, 8 or more for level 5)     ED Triage Vitals [07/13/22 0030]   BP Temp Temp src Pulse Respirations SpO2 Height Weight - Scale   (!) 151/97 98.4 F (36.9 C) -- (!) 107 15 99 % 5\' 7"  (1.702 m) 237 lb (107.5 kg)       Physical Exam  Vitals and nursing note reviewed.   Constitutional:       General: She is not in acute distress.     Appearance: She is not ill-appearing.   HENT:      Head: Normocephalic and atraumatic.      Mouth/Throat:      Mouth: Mucous membranes are moist.   Eyes:      General: No scleral icterus.     Extraocular Movements: Extraocular movements intact.   Cardiovascular:      Rate and Rhythm: Normal rate and regular rhythm.      Heart sounds: Normal heart sounds.   Pulmonary:      Effort: Pulmonary effort is normal. No respiratory distress.       Breath sounds: Normal breath sounds. No wheezing.   Abdominal:      General: Bowel sounds are normal.      Palpations: Abdomen is soft.      Tenderness: There is no abdominal tenderness. There is no guarding.      Hernia: No hernia is present.      Comments: Benign abdominal exam   Musculoskeletal:         General: Normal range of motion.      Cervical back: Normal range of motion and neck supple.   Skin:     General: Skin is warm.   Neurological:      General: No focal deficit present.      Mental Status: She is alert and oriented to person, place, and time.      GCS: GCS eye subscore is 4. GCS verbal subscore is 5. GCS motor subscore is 6.      Cranial Nerves: No cranial nerve deficit.      Sensory: No sensory deficit.      Motor: No weakness.   Psychiatric:         Mood and Affect: Mood normal.         Speech: Speech normal.         Behavior: Behavior normal.       DIAGNOSTIC RESULTS     EKG: All EKG's are interpreted by the Emergency Department Physician who either signs or Co-signs this chart in the absence of a cardiologist.    none, by my interpretation    RADIOLOGY:   Non-plain film images such as CT, Ultrasound and MRI are read by the radiologist. Plain radiographic images are visualized and preliminarily interpreted by the emergency physician with the below findings:    No visualized adnexal mass or ectopic pregnancy, good flow to ovary, per my interpretation    Interpretation per the Radiologist below, if available at the time of this note:    OB TRANSVAGINAL    (Results Pending)   [place] DUP ABD PEL RETRO SCROT LIMITED    (Results Pending)         ED BEDSIDE ULTRASOUND:   Performed by ED Physician - none  LABS:  Labs Reviewed - No data to display    All other labs were within normal range or not returned as of this dictation.    EMERGENCY DEPARTMENT COURSE and DIFFERENTIAL DIAGNOSIS/MDM:   Vitals:    Vitals:    07/13/22 0030   BP: (!) 151/97   Pulse: (!) 107   Resp: 15   Temp: 98.4 F (36.9 C)    SpO2: 99%   Weight: 107.5 kg   Height: 5\' 7"  (1.702 m)       Differential diagnosis: Implantation pain, miscarriage, threatened abortion, pregnancy pain, torsion, cyst, ectopic  Other testing considered:  Prior Inpatient/External notes reviewed:    Medical Decision Making  Sara Cole is a 31 y.o. female denies any significant history who presents to the emergency department with reported pregnancy abdominal pain.  Patient presents with left lower quadrant abdominal pain reports it is mild to moderate coming and going.  Denies history of ovarian pathology or prior ectopic.  First pregnancy.  Patient in no acute distress mild tachycardia.  Patient denies other systemic symptoms.  Benign abdominal exam.  Patient just evaluated at [facility] told she was too early in pregnancy for evaluation with ultrasound that is her main concern.  Given her left lower quadrant abdominal discomfort we will obtain transvaginal ultrasound to evaluate for ectopic or torsion.  Will refer to OB/GYN as well.    Amount and/or Complexity of Data Reviewed  Radiology: ordered and independent interpretation performed. Decision-making details documented in ED Course.            REASSESSMENT      Pt remains well appearing in NAD.  26 per vision with no visualized intrauterine pregnancy, no visualized adnexal mass, good flow to ovaries.  OBGYN follow up.  Return precautions given.  Pt comfortable with plan.        CONSULTS:  None    PROCEDURES:  Unless otherwise noted below, none     Procedures    FINAL IMPRESSION      1. Vaginal bleeding in pregnancy    2. Abdominal pain, left lower quadrant          DISPOSITION/PLAN   DISPOSITION Decision To Discharge 07/13/2022 01:22:43 AM      PATIENT REFERRED TO:  [facility]  [address]  [place]  [phone]  Go today  If symptoms worsen    [phone], MD  [address] [place]  [phone]    Schedule an appointment as soon as  possible for a visit in 3 days        DISCHARGE MEDICATIONS:  New Prescriptions    No medications on file     Controlled Substances Monitoring:     No flowsheet data found.    (Please note that portions of this note were completed with a voice recognition program.  Efforts were made to edit the dictations but occasionally words are mis-transcribed.)    [phone], MD (electronically signed)  Attending Emergency Physician           Florinda Marker, MD  07/13/22 [phone]

## 2022-07-16 ENCOUNTER — Encounter (HOSPITAL_COMMUNITY): Payer: Self-pay | Admitting: Obstetrics and Gynecology

## 2022-07-16 ENCOUNTER — Inpatient Hospital Stay (HOSPITAL_COMMUNITY): Payer: Managed Care, Other (non HMO)

## 2022-07-16 ENCOUNTER — Inpatient Hospital Stay (HOSPITAL_COMMUNITY)
Admission: AD | Admit: 2022-07-16 | Discharge: 2022-07-17 | Disposition: A | Discharge status: Home / Self Care | Payer: Managed Care, Other (non HMO) | Attending: Obstetrics and Gynecology | Admitting: Obstetrics and Gynecology

## 2022-07-16 DIAGNOSIS — Z3490 Encounter for supervision of normal pregnancy, unspecified, unspecified trimester: Secondary | ICD-10-CM | POA: Diagnosis not present

## 2022-07-16 DIAGNOSIS — Z6720 Type B blood, Rh positive: Secondary | ICD-10-CM | POA: Insufficient documentation

## 2022-07-16 DIAGNOSIS — O209 Hemorrhage in early pregnancy, unspecified: Secondary | ICD-10-CM | POA: Diagnosis present

## 2022-07-16 DIAGNOSIS — Z3201 Encounter for pregnancy test, result positive: Secondary | ICD-10-CM | POA: Diagnosis not present

## 2022-07-16 LAB — CBC
HCT: 36.9 % (ref 36.0–46.0)
Hemoglobin: 11.7 g/dL — ABNORMAL LOW (ref 12.0–15.0)
MCH: 25.5 pg — ABNORMAL LOW (ref 26.0–34.0)
MCHC: 31.7 g/dL (ref 30.0–36.0)
MCV: 80.6 fL (ref 80.0–100.0)
Platelets: 276 10*3/uL (ref 150–400)
RBC: 4.58 MIL/uL (ref 3.87–5.11)
RDW: 16.2 % — ABNORMAL HIGH (ref 11.5–15.5)
WBC: 13.8 10*3/uL — ABNORMAL HIGH (ref 4.0–10.5)
nRBC: 0 % (ref 0.0–0.2)

## 2022-07-16 LAB — WET PREP, GENITAL
Clue Cells Wet Prep HPF POC: NONE SEEN
Sperm: NONE SEEN
Trich, Wet Prep: NONE SEEN
WBC, Wet Prep HPF POC: <10 (ref <10)
Yeast Wet Prep HPF POC: NONE SEEN

## 2022-07-16 LAB — POCT PREGNANCY, URINE: Preg Test, Ur: POSITIVE — AB

## 2022-07-16 NOTE — MAU Note (Addendum)
.  Brianna Oliver is a 31 y.o. at Unknown here in MAU reporting: heavy bright red VB that started 4 days ago with light spotting and increased to a heavy flow like period. Pt not wearing a pad, stated it comes intermittently. Pt reports intermittent left sided ABD cramping that has been for the last 4 days as well. Pt denies LOF, loss of tissue, abnormal discharge. Last intercourse was this am. Pt went to ED and got a POS preg test.   LMP: 06/11/2022 Onset of complaint: 4 days  Pain score: 7/10 Vitals:   07/16/22 2246  BP: 132/85  Pulse: 81  Resp: 18  Temp: 97.9 F (36.6 C)  SpO2: 100%      Lab orders placed from triage:  UPT, UA

## 2022-07-16 NOTE — MAU Provider Note (Signed)
History     CSN: [id number]  Arrival date and time: 07/16/22 2153   Event Date/Time   First Provider Initiated Contact with Patient 07/16/22 2315      Chief Complaint  Patient presents with   Vaginal Bleeding   HPI Vaginal bleeding, new onset four days ago. Patient was evaluated at an ED in [place], learned she was pregnant and her quant hCG was tracked x 2 visits. Her bleeding has waxed and waned since onset. She endorses heavier bleeding following sexual intercourse this morning. She is not seeing clots. She is not saturating pads. She is not experiencing weakness, dizziness or syncope.  Patient also c/o LLQ abdominal pain, new onset four days ago. Pain score is 7/10 and crampy.  She denies abdominal tenderness, dysuria, fever or recent illness.   OB History     Gravida  2   Para  0   Term  0   Preterm  0   AB  1   Living  0      SAB  1   IAB  0   Ectopic  0   Multiple  0   Live Births  0           Past Medical History:  Diagnosis Date   At risk for infertility 2018   Fibroid    Morbid obesity ([place])     Past Surgical History:  Procedure Laterality Date   APPENDECTOMY      Family History  Problem Relation Age of Onset   Healthy Mother    Anxiety disorder Neg Hx    Birth defects Neg Hx    Cancer Neg Hx    COPD Neg Hx    Depression Neg Hx    Diabetes Neg Hx    Drug abuse Neg Hx    Early death Neg Hx    Heart disease Neg Hx    Hearing loss Neg Hx    ADD / ADHD Neg Hx    Alcohol abuse Neg Hx    Arthritis Neg Hx    Asthma Neg Hx    Hypertension Neg Hx    Hyperlipidemia Neg Hx    Intellectual disability Neg Hx    Kidney disease Neg Hx    Learning disabilities Neg Hx    Miscarriages / Stillbirths Neg Hx    Obesity Neg Hx    Stroke Neg Hx    Vision loss Neg Hx    Varicose Veins Neg Hx     Social History   Tobacco Use   Smoking status: Every Day    Packs/day: 0.25    Types: Cigarettes   Smokeless tobacco: Never  Substance  Use Topics   Alcohol use: Yes    Comment: social; not while preg   Drug use: Yes    Types: Marijuana    Comment: 07/14/2022    Allergies: No Known Allergies  Medications Prior to Admission  Medication Sig Dispense Refill Last Dose   Prenatal Vit-Fe Fumarate-FA (PRENATAL VITAMINS PO) Take 1 tablet by mouth daily.      cephALEXin (KEFLEX) 500 MG capsule Take 1 capsule (500 mg total) by mouth 4 (four) times daily. 28 capsule 0    docusate sodium (COLACE) 100 MG capsule Take 1 capsule (100 mg total) by mouth 3 (three) times daily. 60 capsule 3    phentermine (ADIPEX-P) 37.5 MG tablet Take 37.5 mg by mouth daily.      polyethylene glycol (MIRALAX) packet Take 17 g by mouth daily.  14 each 0     Review of Systems  Gastrointestinal:  Positive for abdominal pain.  Genitourinary:  Positive for vaginal bleeding.  All other systems reviewed and are negative.  Physical Exam   Blood pressure 132/85, pulse 81, temperature 97.9 F (36.6 C), temperature source Oral, resp. rate 18, height '5\' 7"'$  (1.702 m), weight 123.3 kg, last menstrual period 06/11/2022, SpO2 100 %, unknown if currently breastfeeding.  Physical Exam Vitals and nursing note reviewed. Exam conducted with a chaperone present.  Constitutional:      General: She is not in acute distress.    Appearance: Normal appearance. She is obese. She is not ill-appearing.  Cardiovascular:     Rate and Rhythm: Normal rate and regular rhythm.     Pulses: Normal pulses.     Heart sounds: Normal heart sounds.  Pulmonary:     Effort: Pulmonary effort is normal.     Breath sounds: Normal breath sounds.  Abdominal:     General: Abdomen is flat.     Tenderness: There is no abdominal tenderness. There is no guarding or rebound.  Musculoskeletal:        General: Normal range of motion.  Skin:    Capillary Refill: Capillary refill takes less than 2 seconds.  Neurological:     Mental Status: She is alert and oriented to person, place, and time.   Psychiatric:        Mood and Affect: Mood normal.        Behavior: Behavior normal.        Thought Content: Thought content normal.        Judgment: Judgment normal.     MAU Course  Procedures  MDM  Patient Vitals for the past 24 hrs:  BP Temp Temp src Pulse Resp SpO2 Height Weight  07/16/22 2246 132/85 97.9 F (36.6 C) Oral 81 18 100 % -- --  07/16/22 2242 -- -- -- -- -- -- '5\' 7"'$  (1.702 m) 123.3 kg   Orders Placed This Encounter  Procedures   Wet prep, genital   US OB LESS THAN 14 WEEKS WITH OB TRANSVAGINAL   CBC   hCG, quantitative, pregnancy   Nursing communication   Pregnancy, urine POC   Results for orders placed or performed during the hospital encounter of 07/16/22 (from the past 24 hour(s))  Pregnancy, urine POC     Status: Abnormal   Collection Time: 07/16/22 10:37 PM  Result Value Ref Range   Preg Test, Ur POSITIVE (A) NEGATIVE  Wet prep, genital     Status: None   Collection Time: 07/16/22 11:15 PM   Specimen: Vaginal  Result Value Ref Range   Yeast Wet Prep HPF POC NONE SEEN NONE SEEN   Trich, Wet Prep NONE SEEN NONE SEEN   Clue Cells Wet Prep HPF POC NONE SEEN NONE SEEN   WBC, Wet Prep HPF POC <10 <10   Sperm NONE SEEN   CBC     Status: Abnormal   Collection Time: 07/16/22 11:23 PM  Result Value Ref Range   WBC 13.8 (H) 4.0 - 10.5 K/uL   RBC 4.58 3.87 - 5.11 MIL/uL   Hemoglobin 11.7 (L) 12.0 - 15.0 g/dL   HCT 36.9 36.0 - 46.0 %   MCV 80.6 80.0 - 100.0 fL   MCH 25.5 (L) 26.0 - 34.0 pg   MCHC 31.7 30.0 - 36.0 g/dL   RDW 16.2 (H) 11.5 - 15.5 %   Platelets 276 150 - 400 K/uL  nRBC 0.0 0.0 - 0.2 %  hCG, quantitative, pregnancy     Status: Abnormal   Collection Time: 07/16/22 11:23 PM  Result Value Ref Range   hCG, Beta Chain, Quant, S 3,471 (H) <5 mIU/mL   US OB LESS THAN 14 WEEKS WITH OB TRANSVAGINAL  Result Date: 07/16/2022 CLINICAL DATA:  Heavy vaginal bleeding and left lower quadrant pain for 4 days. Estimated gestational age by LMP is 5  weeks 0 days. Quantitative beta HCG is in progress. EXAM: OBSTETRIC <14 WK [place] AND TRANSVAGINAL OB US TECHNIQUE: Both transabdominal and transvaginal ultrasound examinations were performed for complete evaluation of the gestation as well as the maternal uterus, adnexal regions, and pelvic cul-de-sac. Transvaginal technique was performed to assess early pregnancy. COMPARISON:  None Available. FINDINGS: Intrauterine gestational sac: A single intrauterine gestational sac is visualized. Yolk sac:  A 1.3 mm yolk sac is identified. Embryo:  Fetal pole is not seen. Cardiac Activity: Fetal cardiac activity is not observed. MSD: 5.4 mm   5 w   2 d Subchorionic hemorrhage:  None visualized. Maternal uterus/adnexae: Uterus is anteverted. Heterogeneous myometrial mass lesion anteriorly measuring 4 cm maximal diameter consistent with fibroid. Both ovaries are visualized and appear normal. Small corpus luteal cyst on the left. Small amount of free fluid in the pelvis. IMPRESSION: Probable early intrauterine gestational sac with a yolk sac, but no fetal pole, or cardiac activity yet visualized. Recommend follow-up quantitative B-HCG levels and follow-up US in 14 days to assess viability. This recommendation follows SRU consensus guidelines: Diagnostic Criteria for Nonviable Pregnancy Early in the First Trimester. [place] Med 2013; 222:9798-92. Electronically Signed   By: Lucienne Capers M.D.   On: 07/16/2022 23:52     Assessment and Plan  --31 y.o. G2P0010 with IUP (+ GS, + YS) --Quant hcg 3471 --Hgb 11.7, blood type B POS --Discharge home in stable condition with first trimester precautions  F/U: Order placed for viability scan at [facility] in two weeks  Darlina Rumpf, MSA, MSN, CNM 07/17/2022, 1:15 AM

## 2022-07-17 DIAGNOSIS — Z3490 Encounter for supervision of normal pregnancy, unspecified, unspecified trimester: Secondary | ICD-10-CM | POA: Diagnosis not present

## 2022-07-17 LAB — HCG, QUANTITATIVE, PREGNANCY: hCG, Beta Chain, Quant, S: 3471 m[IU]/mL — ABNORMAL HIGH (ref <5)

## 2022-07-17 NOTE — Discharge Instructions (Signed)

## 2022-07-19 ENCOUNTER — Other Ambulatory Visit: Payer: Self-pay | Admitting: Advanced Practice Midwife

## 2022-07-19 LAB — GC/CHLAMYDIA PROBE AMP (~~LOC~~) NOT AT ARMC
Chlamydia: POSITIVE — AB
Comment: NEGATIVE
Comment: NORMAL
Neisseria Gonorrhea: NEGATIVE

## 2022-07-19 MED ORDER — PRENATAL VITAMINS 28-0.8 MG PO TABS: 1.0000 | ORAL_TABLET | Freq: Every day | ORAL | 0 refills | Status: AC

## 2022-07-19 MED ORDER — AZITHROMYCIN 250 MG PO TABS: 1000.0000 mg | ORAL_TABLET | Freq: Once | ORAL | 0 refills | Status: AC

## 2022-07-19 NOTE — Progress Notes (Signed)
Chlamydia treatment and Prenatal vitamin sent to updated pharmacy in [facility] per patient request.  Mallie Snooks, [place], MSN, CNM Certified Nurse Midwife, [facility] for [employer], [place]

## 2022-08-24 LAB — CBC WITH AUTO DIFFERENTIAL
Absolute Baso #: 0 10*3/uL (ref 0.0–0.2)
Absolute Eos #: 0.2 10*3/uL (ref 0.0–0.5)
Absolute Lymph #: 2.4 10*3/uL (ref 1.0–3.2)
Absolute Mono #: 0.6 10*3/uL (ref 0.3–1.0)
Basophils %: 0.3 % (ref 0.0–2.0)
Eosinophils %: 1.9 % (ref 0.0–7.0)
Hematocrit: 37.6 % (ref 34.0–47.0)
Hemoglobin: 12 g/dL (ref 11.5–15.7)
Immature Grans (Abs): 0.08 10*3/uL — ABNORMAL HIGH (ref 0.00–0.06)
Immature Granulocytes: 0.8 % — ABNORMAL HIGH (ref 0.0–0.6)
Lymphocytes: 23 % (ref 15.0–45.0)
MCH: 25.2 pg — ABNORMAL LOW (ref 27.0–34.5)
MCHC: 31.9 g/dL — ABNORMAL LOW (ref 32.0–36.0)
MCV: 79 fL — ABNORMAL LOW (ref 81.0–99.0)
MPV: 9.7 fL (ref 7.2–13.2)
Monocytes %: 5.5 % (ref 4.0–12.0)
NRBC Absolute: 0 10*3/uL (ref 0.000–0.012)
NRBC Automated: 0 % (ref 0.0–0.2)
Neutrophils %: 68.5 % (ref 42.0–74.0)
Neutrophils Absolute: 7.2 10*3/uL (ref 1.6–7.3)
Platelets: 299 10*3/uL (ref 140–440)
RBC: 4.76 x10e6/mcL (ref 3.60–5.20)
RDW: 15.4 % (ref 11.0–16.0)
WBC: 10.5 10*3/uL (ref 3.8–10.6)

## 2022-08-24 LAB — HIV SCREEN: HIV Screen: NEGATIVE

## 2022-08-24 LAB — HEMOGLOBIN A1C
Est. Avg. Glucose, WB: 114
Est. Avg. Glucose-calculated: 122
Hemoglobin A1C: 5.6 % (ref 4.0–6.0)

## 2022-08-24 LAB — RUBELLA ANTIBODY, IGG
Rubella IgG Scr Interp: REACTIVE
Rubella IgG Scr: 251 IU/mL

## 2022-08-24 LAB — HEPATITIS B SURFACE ANTIGEN: Hepatitis B Surface Ag: NEGATIVE

## 2022-08-24 LAB — HEPATITIS C ANTIBODY: Hepatitis C Ab: NEGATIVE

## 2022-08-25 LAB — ABO/RH: ABO/Rh: B POS

## 2022-08-25 LAB — RPR: RPR: NONREACTIVE

## 2022-08-25 LAB — ANTIBODY SCREEN: Antibody Screen: NEGATIVE

## 2022-08-25 LAB — VARICELLA ZOSTER ANTIBODY, IGG: Varicella-Zoster Virus Ab, Igg: 225 Index (ref >165)

## 2022-10-29 ENCOUNTER — Ambulatory Visit: Payer: MEDICAID
